# Patient Record
Sex: Female | Born: 1935 | Race: White | Hispanic: No | Smoking: Former smoker
Health system: Southern US, Community
[De-identification: ages and names within clinical notes are randomized; demographics above are authoritative.]

## PROBLEM LIST (undated history)

## (undated) DIAGNOSIS — I639 Cerebral infarction, unspecified: Secondary | ICD-10-CM

## (undated) DIAGNOSIS — F4320 Adjustment disorder, unspecified: Secondary | ICD-10-CM

## (undated) DIAGNOSIS — G4700 Insomnia, unspecified: Secondary | ICD-10-CM

## (undated) DIAGNOSIS — N189 Chronic kidney disease, unspecified: Secondary | ICD-10-CM

## (undated) HISTORY — PX: KNEE ARTHROSCOPY: SUR90

## (undated) HISTORY — PX: REPLACEMENT TOTAL KNEE: SUR1224

## (undated) HISTORY — PX: TOTAL SHOULDER REPLACEMENT: SUR1217

## (undated) HISTORY — PX: VAGINAL HYSTERECTOMY: SUR661

## (undated) NOTE — *Deleted (*Deleted)
Physical Medicine and Rehabilitation Consult   Reason for Consult: Stroke with functional deficits  Referring Physician: Dr. Thedore Mins   HPI: Shannon Nixon is a 5 y.o. female with history of prior strokes, HTN, CKD, adjustment disorder who was admitted on 01/07/20 with acute onset of speech difficulty. MRI brain showed acute high left frontal cortical with additional right parietal, right frontal, posterior left temporal lobe infract suggestive for cardioembolic etiology. 2D echo showed EF 60-65% with grade 1 DD and mild LVH. BLE dopplers and carotid dopplers done and loop recorder placed on 10/25 as part of stroke work up. Therapy evaluations completed and CIR recommended due to functional decline.   Daughter moved her back to [place] 7 months ago. Her daughter assisted with bathing and dressing as well as medication administration. Was independent without AD.    Review of Systems  Constitutional: Negative for chills and fever.  HENT: Negative for hearing loss.   Eyes: Negative for blurred vision and double vision.  Respiratory: Negative for cough and shortness of breath.   Cardiovascular: Negative for chest pain and palpitations.  Gastrointestinal: Negative for constipation, heartburn and nausea.  Musculoskeletal: Negative for falls, joint pain and myalgias.  Skin: Negative for rash.  Neurological: Positive for speech change. Negative for dizziness, tingling, tremors and headaches.  Psychiatric/Behavioral: The patient is nervous/anxious.      Past Medical History:  Diagnosis Date  . Adjustment disorder   . CKD (chronic kidney disease)   . Insomnia   . Stroke (cerebrum) [facility])      Past Surgical History:  Procedure Laterality Date  . KNEE ARTHROSCOPY    . LOOP RECORDER INSERTION N/A 01/09/2020   Procedure: LOOP RECORDER INSERTION;  Surgeon: Marinus Maw, MD;  Location: [facility] INVASIVE CV LAB;  Service: Cardiovascular;  Laterality: N/A;  . REPLACEMENT TOTAL KNEE Left   .  REPLACEMENT TOTAL KNEE Left   . TOTAL SHOULDER REPLACEMENT Right   . VAGINAL HYSTERECTOMY      Family History  Problem Relation Age of Onset  . Heart disease Father     Social History: Lives with family. Independent without AD. She  reports that she has quit smoking-3 years ago--she smoked for 40 years.. She has never used smokeless tobacco. She drinks a glass of wine occasionally.  She reports that she does not use drugs.    Allergies: No Known Allergies    Medications Prior to Admission  Medication Sig Dispense Refill  . aspirin 325 MG tablet Take 325 mg by mouth daily.    Marland Kitchen atorvastatin (LIPITOR) 40 MG tablet Take 40 mg by mouth daily.    . citalopram (CELEXA) 10 MG tablet Take 10 mg by mouth daily.    . COLLAGEN PO Take 1 capsule by mouth in the morning and at bedtime.    . gabapentin (NEURONTIN) 100 MG capsule Take 100 mg by mouth at bedtime.    Marland Kitchen losartan (COZAAR) 25 MG tablet Take 25 mg by mouth daily.    . mirtazapine (REMERON) 15 MG tablet Take 15 mg by mouth at bedtime.    . Multiple Vitamins-Minerals (MULTIVITAMIN WITH MINERALS) tablet Take 1 tablet by mouth daily.    Marland Kitchen omeprazole (PRILOSEC) 40 MG capsule Take 40 mg by mouth daily.    . traZODone (DESYREL) 100 MG tablet Take 100 mg by mouth at bedtime.      Home: Home Living Family/patient expects to be discharged to:: Private residence Living Arrangements: Alone Available Help at Discharge: Family,  Available PRN/intermittently Type of Home: House Home Access: Stairs to enter [employer] of Steps: ??? Home Layout: One level Home Equipment:  (unsure) Additional Comments: pt with aphasia and unable to specify  Functional History: Prior Function Level of Independence: Independent Comments: pt reports independent, living alone. Will need to confirm with family due to communication deficits Functional Status:  Mobility: Bed Mobility Overal bed mobility: Needs Assistance Bed Mobility: Sit to Supine,  Supine to Sit Supine to sit: Supervision Sit to supine: Supervision General bed mobility comments: pt able to get self in and out of bed and position self for comfort Transfers Overall transfer level: Needs assistance Equipment used: Rolling walker (2 wheeled) Transfers: Sit to/from Stand Sit to Stand: Min assist Stand pivot transfers: Min assist General transfer comment: steadying assist throughout Ambulation/Gait Ambulation/Gait assistance: Min assist Gait Distance (Feet): 150 Feet Assistive device: Rolling walker (2 wheeled) Gait Pattern/deviations: Trunk flexed, Step-through pattern General Gait Details: vc's for erect posture though pt maintains slight trunk flexion. Needed tactile cues occasionally to help remain in RW. R foot externally rotated Gait velocity: reduced Gait velocity interpretation: <1.8 ft/sec, indicate of risk for recurrent falls    ADL: ADL Overall ADL's : Needs assistance/impaired Eating/Feeding: Set up, Sitting Grooming: Minimal assistance, Standing, Wash/dry hands, Oral care Grooming Details (indicate cue type and reason): for balance; cues to perform all tasks of handwashing task, cues to locate oral care items on sink (?visuoperceptual deficit)  Upper Body Bathing: Minimal assistance, Sitting Lower Body Bathing: Moderate assistance, Sit to/from stand Upper Body Dressing : Minimal assistance, Sitting Lower Body Dressing: Moderate assistance, Sit to/from stand Toilet Transfer: Minimal assistance, Ambulation, Regular Toilet Toileting- Clothing Manipulation and Hygiene: Sit to/from stand, Sitting/lateral lean, Moderate assistance Toileting - Clothing Manipulation Details (indicate cue type and reason): assist for thoroughness during pericare; cues to pull up mesh underwear prior to exiting bathroom as pt initially attempting to walk out prior to doing so; steadying assist throughout while in standing  Functional mobility during ADLs: Minimal assistance   Cognition: Cognition Overall Cognitive Status: Difficult to assess Arousal/Alertness: Awake/alert Orientation Level: Oriented to person, Oriented to place, Disoriented to situation, Disoriented to time Awareness: Appears intact (difficult to assess d/t aphasia; non-verbal communication ) Behaviors: Verbal agitation, Perseveration, Confabulation Comments: Pt able to indicate call button when asked how to call nurse/help Cognition Arousal/Alertness: Awake/alert Behavior During Therapy: Impulsive Overall Cognitive Status: Difficult to assess General Comments: pt following simple commands today, occasionally commands needed repeating. Followed directional cues in hallway Difficult to assess due to: Impaired communication   Blood pressure (!) 152/84, pulse 80, temperature 98.5 F (36.9 C), temperature source Oral, resp. rate 18, height 5\' 6"  (1.676 m), weight 74.6 kg, SpO2 98 %. Physical Exam Vitals and nursing note reviewed.  Constitutional:      Appearance: Normal appearance.     Comments: Frustrated by expressive deficits.   Neurological:     Mental Status: She is alert and oriented to person, place, and time.     Comments: She was able to state her name. Her  verbal output limited to inaccurate Y/N answers but does not appropriately. She was able to follow simple one step commands. LUE with limitations due to prior surgery. Mild RUE weakness noted.      Results for orders placed or performed during the hospital encounter of 01/07/20 (from the past 24 hour(s))  CBC with Differential/Platelet     Status: Abnormal   Collection Time: 01/10/20  5:02 AM  Result Value  Ref Range   WBC 7.0 4.0 - 10.5 K/uL   RBC 3.60 (L) 3.87 - 5.11 MIL/uL   Hemoglobin 11.3 (L) 12.0 - 15.0 g/dL   HCT 16.1 (L) 36 - 46 %   MCV 94.7 80.0 - 100.0 fL   MCH 31.4 26.0 - 34.0 pg   MCHC 33.1 30.0 - 36.0 g/dL   RDW 09.6 04.5 - 40.9 %   Platelets 187 150 - 400 K/uL   nRBC 0.0 0.0 - 0.2 %   Neutrophils Relative  % 55 %   Neutro Abs 3.9 1.7 - 7.7 K/uL   Lymphocytes Relative 27 %   Lymphs Abs 1.9 0.7 - 4.0 K/uL   Monocytes Relative 15 %   Monocytes Absolute 1.1 (H) 0.1 - 1.0 K/uL   Eosinophils Relative 2 %   Eosinophils Absolute 0.2 0.0 - 0.5 K/uL   Basophils Relative 0 %   Basophils Absolute 0.0 0.0 - 0.1 K/uL   Immature Granulocytes 1 %   Abs Immature Granulocytes 0.06 0.00 - 0.07 K/uL  Comprehensive metabolic panel     Status: Abnormal   Collection Time: 01/10/20  5:02 AM  Result Value Ref Range   Sodium 137 135 - 145 mmol/L   Potassium 3.9 3.5 - 5.1 mmol/L   Chloride 105 98 - 111 mmol/L   CO2 21 (L) 22 - 32 mmol/L   Glucose, Bld 92 70 - 99 mg/dL   BUN 17 8 - 23 mg/dL   Creatinine, Ser 8.11 (H) 0.44 - 1.00 mg/dL   Calcium 9.1 8.9 - 91.4 mg/dL   Total Protein 6.4 (L) 6.5 - 8.1 g/dL   Albumin 3.7 3.5 - 5.0 g/dL   AST 27 15 - 41 U/L   ALT 14 0 - 44 U/L   Alkaline Phosphatase 76 38 - 126 U/L   Total Bilirubin 1.2 0.3 - 1.2 mg/dL   GFR, Estimated 43 (L) >60 mL/min   Anion gap 11 5 - 15  Brain natriuretic peptide     Status: None   Collection Time: 01/10/20  5:02 AM  Result Value Ref Range   B Natriuretic Peptide 66.6 0.0 - 100.0 pg/mL  Magnesium     Status: None   Collection Time: 01/10/20  5:02 AM  Result Value Ref Range   Magnesium 1.7 1.7 - 2.4 mg/dL   EP PPM/ICD IMPLANT  Result Date: 01/09/2020 CONCLUSIONS:  1. Successful implantation of a [employer] Reveal LINQ implantable loop recorder for cryptogenic stroke  2. No early apparent complications. [name], MD 01/09/2020 2:35 PM   ECHOCARDIOGRAM COMPLETE  Result Date: 01/08/2020    ECHOCARDIOGRAM REPORT   Patient Name:   LONISHA BOBBY Date of Exam: 01/08/2020 Medical Rec #:  [id number]         Height:       65.0 in Accession #:    [id number]        Weight:       164.4 lb Date of Birth:  1933/09/11         BSA:          1.820 m Patient Age:    86 years          BP:           121/74 mmHg Patient Gender: F                  HR:           86 bpm. Exam Location:  Inpatient Procedure: 2D Echo,  Cardiac Doppler and Color Doppler Indications:    CVA  History:        Patient has no prior history of Echocardiogram examinations.                 Stroke, Signs/Symptoms:Altered Mental Status; Risk                 Factors:Hypertension and Dyslipidemia.  Sonographer:    Lavenia Atlas Referring Phys: [id number] Cecille Po MELVIN IMPRESSIONS  1. Left ventricular ejection fraction, by estimation, is 60 to 65%. The left ventricle has normal function. The left ventricle has no regional wall motion abnormalities. There is mild left ventricular hypertrophy. Left ventricular diastolic parameters are consistent with Grade I diastolic dysfunction (impaired relaxation).  2. Right ventricular systolic function is normal. The right ventricular size is normal. There is normal pulmonary artery systolic pressure. The estimated right ventricular systolic pressure is 34.8 mmHg.  3. The mitral valve is grossly normal. Mild mitral valve regurgitation. No evidence of mitral stenosis.  4. The aortic valve is tricuspid. There is mild calcification of the aortic valve. There is mild thickening of the aortic valve. Aortic valve regurgitation is not visualized. Mild aortic valve sclerosis is present, with no evidence of aortic valve stenosis.  5. The inferior vena cava is normal in size with greater than 50% respiratory variability, suggesting right atrial pressure of 3 mmHg. FINDINGS  Left Ventricle: Left ventricular ejection fraction, by estimation, is 60 to 65%. The left ventricle has normal function. The left ventricle has no regional wall motion abnormalities. The left ventricular internal cavity size was normal in size. There is  mild left ventricular hypertrophy. Left ventricular diastolic parameters are consistent with Grade I diastolic dysfunction (impaired relaxation). Normal left ventricular filling pressure. Right Ventricle: The right ventricular size is  normal. No increase in right ventricular wall thickness. Right ventricular systolic function is normal. There is normal pulmonary artery systolic pressure. The tricuspid regurgitant velocity is 2.82 m/s, and  with an assumed right atrial pressure of 3 mmHg, the estimated right ventricular systolic pressure is 34.8 mmHg. Left Atrium: Left atrial size was normal in size. Right Atrium: Right atrial size was normal in size. Pericardium: Trivial pericardial effusion is present. Mitral Valve: The mitral valve is grossly normal. There is mild calcification of the mitral valve leaflet(s). Mild mitral valve regurgitation. No evidence of mitral valve stenosis. Tricuspid Valve: The tricuspid valve is grossly normal. Tricuspid valve regurgitation is trivial. No evidence of tricuspid stenosis. Aortic Valve: The aortic valve is tricuspid. There is mild calcification of the aortic valve. There is mild thickening of the aortic valve. Aortic valve regurgitation is not visualized. Mild aortic valve sclerosis is present, with no evidence of aortic valve stenosis. Pulmonic Valve: The pulmonic valve was grossly normal. Pulmonic valve regurgitation is not visualized. No evidence of pulmonic stenosis. Aorta: The aortic root is normal in size and structure. Venous: The inferior vena cava is normal in size with greater than 50% respiratory variability, suggesting right atrial pressure of 3 mmHg. IAS/Shunts: The atrial septum is grossly normal.  LEFT VENTRICLE PLAX 2D LVIDd:         3.60 cm  Diastology LVIDs:         2.60 cm  LV e' medial:    6.20 cm/s LV PW:         1.30 cm  LV E/e' medial:  8.7 LV IVS:        1.30 cm  LV e' lateral:  6.64 cm/s LVOT diam:     1.90 cm  LV E/e' lateral: 8.1 LV SV:         46 LV SV Index:   25 LVOT Area:     2.84 cm  RIGHT VENTRICLE RV Basal diam:  2.80 cm RV S prime:     10.00 cm/s TAPSE (M-mode): 2.7 cm LEFT ATRIUM             Index       RIGHT ATRIUM           Index LA diam:        3.30 cm 1.81 cm/m  RA  Area:     13.50 cm LA Vol (A2C):   17.9 ml 9.84 ml/m  RA Volume:   34.60 ml  19.01 ml/m LA Vol (A4C):   26.8 ml 14.73 ml/m LA Biplane Vol: 22.5 ml 12.36 ml/m  AORTIC VALVE LVOT Vmax:   79.00 cm/s LVOT Vmean:  52.300 cm/s LVOT VTI:    0.163 m  AORTA Ao Root diam: 2.90 cm MITRAL VALVE               TRICUSPID VALVE MV Area (PHT): 3.99 cm    TR Peak grad:   31.8 mmHg MV Decel Time: 190 msec    TR Vmax:        282.00 cm/s MV E velocity: 54.00 cm/s MV A velocity: 79.30 cm/s  SHUNTS MV E/A ratio:  0.68        Systemic VTI:  0.16 m                            Systemic Diam: 1.90 cm Lennie Odor MD Electronically signed by Lennie Odor MD Signature Date/Time: 01/08/2020/3:34:36 PM    Final    ECHO TEE  Result Date: 01/09/2020    TRANSESOPHOGEAL ECHO REPORT   Patient Name:   JYSSICA RIEF Date of Exam: 01/09/2020 Medical Rec #:  [id number]         Height:       65.0 in Accession #:    [id number]        Weight:       164.4 lb Date of Birth:  12/25/1933         BSA:          1.820 m Patient Age:    86 years          BP:           160/80 mmHg Patient Gender: F                 HR:           90 bpm. Exam Location:  Inpatient Procedure: Transesophageal Echo Indications:    Stroke  History:        Patient has prior history of Echocardiogram examinations.  Sonographer:    Thurman Coyer RDCS (AE) Referring Phys: [id number] DAVID L RINEHULS PROCEDURE: After discussion of the risks and benefits of a TEE, an informed consent was obtained. The transesophogeal probe was passed without difficulty through the esophogus of the patient. Local oropharyngeal anesthetic was provided with Benzocaine spray. Sedation performed by different physician. Image quality was excellent. The patient's vital signs; including heart rate, blood pressure, and oxygen saturation; remained stable throughout the procedure. The patient developed no complications during  the procedure. IMPRESSIONS  1. No SOE patient will proceed with ILR implant with Dr  Ladona Ridgel.  2.  Septal hypokinesis . Left ventricular ejection fraction, by estimation, is 50 to 55%. The left ventricle has low normal function. The left ventricle demonstrates regional wall motion abnormalities (see scoring diagram/findings for description). There  is mild asymmetric left ventricular hypertrophy of the basal and septal segments.  3. Right ventricular systolic function is normal. The right ventricular size is normal.  4. No left atrial/left atrial appendage thrombus was detected.  5. The mitral valve is normal in structure. Mild mitral valve regurgitation. No evidence of mitral stenosis.  6. The aortic valve is normal in structure. Aortic valve regurgitation is not visualized. No aortic stenosis is present.  7. The inferior vena cava is normal in size with greater than 50% respiratory variability, suggesting right atrial pressure of 3 mmHg.  8. Agitated saline contrast bubble study was negative, with no evidence of any interatrial shunt. Conclusion(s)/Recommendation(s): Normal biventricular function without evidence of hemodynamically significant valvular heart disease. FINDINGS  Left Ventricle: Septal hypokinesis. Left ventricular ejection fraction, by estimation, is 50 to 55%. The left ventricle has low normal function. The left ventricle demonstrates regional wall motion abnormalities. The left ventricular internal cavity size was normal in size. There is mild asymmetric left ventricular hypertrophy of the basal and septal segments. Right Ventricle: The right ventricular size is normal. No increase in right ventricular wall thickness. Right ventricular systolic function is normal. Left Atrium: Left atrial size was normal in size. No left atrial/left atrial appendage thrombus was detected. Right Atrium: Right atrial size was normal in size. Pericardium: There is no evidence of pericardial effusion. Mitral Valve: The mitral valve is normal in structure. Mild mitral valve regurgitation. No evidence of  mitral valve stenosis. Tricuspid Valve: The tricuspid valve is normal in structure. Tricuspid valve regurgitation is mild . No evidence of tricuspid stenosis. Aortic Valve: The aortic valve is normal in structure. Aortic valve regurgitation is not visualized. No aortic stenosis is present. Pulmonic Valve: The pulmonic valve was normal in structure. Pulmonic valve regurgitation is trivial. No evidence of pulmonic stenosis. Aorta: The aortic root is normal in size and structure. Venous: The inferior vena cava is normal in size with greater than 50% respiratory variability, suggesting right atrial pressure of 3 mmHg. IAS/Shunts: No atrial level shunt detected by color flow Doppler. Agitated saline contrast was given intravenously to evaluate for intracardiac shunting. Agitated saline contrast bubble study was negative, with no evidence of any interatrial shunt. Additional Comments: No SOE patient will proceed with ILR implant with Dr Ladona Ridgel. Charlton Haws MD Electronically signed by Charlton Haws MD Signature Date/Time: 01/09/2020/2:01:59 PM    Final    VAS US CAROTID (at [facility] and [facility] only)  Result Date: 01/09/2020 Carotid Arterial Duplex Study Indications:       CVA and Speech disturbance. Risk Factors:      Hypertension, hyperlipidemia. Other Factors:     History of prior strokes, most recently 2020 at [facility]. Comparison Study:  No prior. Performing Technologist: Marilynne Halsted RDMS, RVT  Examination Guidelines: A complete evaluation includes B-mode imaging, spectral Doppler, color Doppler, and power Doppler as needed of all accessible portions of each vessel. Bilateral testing is considered an integral part of a complete examination. Limited examinations for reoccurring indications may be performed as noted.  Right Carotid Findings: +----------+--------+--------+--------+------------------+------------------+           PSV cm/sEDV cm/sStenosisPlaque DescriptionComments            +----------+--------+--------+--------+------------------+------------------+ CCA Prox  63      18  intimal thickening +----------+--------+--------+--------+------------------+------------------+ CCA Distal80      21                                intimal thickening +----------+--------+--------+--------+------------------+------------------+ ICA Prox  58      19      1-39%   calcific                             +----------+--------+--------+--------+------------------+------------------+ ICA Distal81      27                                                   +----------+--------+--------+--------+------------------+------------------+ ECA       83      13                                                   +----------+--------+--------+--------+------------------+------------------+ +----------+--------+-------+----------------+-------------------+           PSV cm/sEDV cmsDescribe        Arm Pressure (mmHG) +----------+--------+-------+----------------+-------------------+ [id number]             Multiphasic, WNL                    +----------+--------+-------+----------------+-------------------+ +---------+--------+--+--------+--+---------+ VertebralPSV cm/s55EDV cm/s16Antegrade +---------+--------+--+--------+--+---------+  Left Carotid Findings: +----------+--------+--------+--------+------------------+------------------+           PSV cm/sEDV cm/sStenosisPlaque DescriptionComments           +----------+--------+--------+--------+------------------+------------------+ CCA Prox  158     22                                                   +----------+--------+--------+--------+------------------+------------------+ CCA Distal69      22                                intimal thickening +----------+--------+--------+--------+------------------+------------------+ ICA Prox  172     43      40-59%  calcific                              +----------+--------+--------+--------+------------------+------------------+ ICA Mid   98      30                                                   +----------+--------+--------+--------+------------------+------------------+ ICA Distal91      30                                                   +----------+--------+--------+--------+------------------+------------------+ ECA       73                                                           +----------+--------+--------+--------+------------------+------------------+ +----------+--------+--------+----------------+-------------------+  PSV cm/sEDV cm/sDescribe        Arm Pressure (mmHG) +----------+--------+--------+----------------+-------------------+ Subclavian89              Multiphasic, WNL                    +----------+--------+--------+----------------+-------------------+ +---------+--------+--+--------+--+---------+ VertebralPSV cm/s40EDV cm/s13Antegrade +---------+--------+--+--------+--+---------+   Summary: Right Carotid: Velocities in the right ICA are consistent with a 1-39% stenosis. Left Carotid: Velocities in the left ICA are consistent with a 40-59% stenosis. Vertebrals: Bilateral vertebral arteries demonstrate antegrade flow. *See table(s) above for measurements and observations.  Electronically signed by Delia Heady MD on 01/09/2020 at 12:56:11 PM.    Final    VAS [place] LOWER EXTREMITY VENOUS (DVT)  Result Date: 01/09/2020  Lower Venous DVTStudy Indications: Embolic strokes.  Comparison Study: No prior. Performing Technologist: Marilynne Halsted RDMS, RVT  Examination Guidelines: A complete evaluation includes B-mode imaging, spectral Doppler, color Doppler, and power Doppler as needed of all accessible portions of each vessel. Bilateral testing is considered an integral part of a complete examination. Limited examinations for reoccurring indications may be performed  as noted. The reflux portion of the exam is performed with the patient in reverse Trendelenburg.  +---------+---------------+---------+-----------+----------+--------------+ RIGHT    CompressibilityPhasicitySpontaneityPropertiesThrombus Aging +---------+---------------+---------+-----------+----------+--------------+ CFV      Full           Yes      Yes                                 +---------+---------------+---------+-----------+----------+--------------+ SFJ      Full                                                        +---------+---------------+---------+-----------+----------+--------------+ FV Prox  Full                                                        +---------+---------------+---------+-----------+----------+--------------+ FV Mid   Full                                                        +---------+---------------+---------+-----------+----------+--------------+ FV DistalFull                                                        +---------+---------------+---------+-----------+----------+--------------+ PFV      Full                                                        +---------+---------------+---------+-----------+----------+--------------+ POP      Full           Yes  Yes                                 +---------+---------------+---------+-----------+----------+--------------+ PTV      Full                                                        +---------+---------------+---------+-----------+----------+--------------+ PERO     Full                                                        +---------+---------------+---------+-----------+----------+--------------+   +---------+---------------+---------+-----------+----------+--------------+ LEFT     CompressibilityPhasicitySpontaneityPropertiesThrombus Aging +---------+---------------+---------+-----------+----------+--------------+ CFV      Full            Yes      Yes                                 +---------+---------------+---------+-----------+----------+--------------+ SFJ      Full                                                        +---------+---------------+---------+-----------+----------+--------------+ FV Prox  Full                                                        +---------+---------------+---------+-----------+----------+--------------+ FV Mid   Full                                                        +---------+---------------+---------+-----------+----------+--------------+ FV DistalFull                                                        +---------+---------------+---------+-----------+----------+--------------+ PFV      Full                                                        +---------+---------------+---------+-----------+----------+--------------+ POP      Full           Yes      Yes                                 +---------+---------------+---------+-----------+----------+--------------+ PTV      Full                                                        +---------+---------------+---------+-----------+----------+--------------+  PERO     Full                                                        +---------+---------------+---------+-----------+----------+--------------+     Summary: BILATERAL: - No evidence of deep vein thrombosis seen in the lower extremities, bilaterally. -No evidence of popliteal cyst, bilaterally.   *See table(s) above for measurements and observations. Electronically signed by Fabienne Bruns MD on 01/09/2020 at 6:22:26 PM.    Final     ***  Jacquelynn Cree, PA-C 01/10/2020

---

## 2018-02-06 ENCOUNTER — Emergency Department (HOSPITAL_BASED_OUTPATIENT_CLINIC_OR_DEPARTMENT_OTHER): Payer: Medicare HMO

## 2018-02-06 ENCOUNTER — Other Ambulatory Visit: Payer: Self-pay

## 2018-02-06 ENCOUNTER — Encounter (HOSPITAL_BASED_OUTPATIENT_CLINIC_OR_DEPARTMENT_OTHER): Payer: Self-pay | Admitting: Emergency Medicine

## 2018-02-06 ENCOUNTER — Emergency Department (HOSPITAL_BASED_OUTPATIENT_CLINIC_OR_DEPARTMENT_OTHER)
Admission: EM | Admit: 2018-02-06 | Discharge: 2018-02-06 | Disposition: A | Discharge status: Home / Self Care | Payer: Medicare HMO | Attending: Emergency Medicine | Admitting: Emergency Medicine

## 2018-02-06 DIAGNOSIS — S22080A Wedge compression fracture of T11-T12 vertebra, initial encounter for closed fracture: Secondary | ICD-10-CM | POA: Insufficient documentation

## 2018-02-06 DIAGNOSIS — Z87891 Personal history of nicotine dependence: Secondary | ICD-10-CM | POA: Diagnosis not present

## 2018-02-06 DIAGNOSIS — W07XXXA Fall from chair, initial encounter: Secondary | ICD-10-CM | POA: Insufficient documentation

## 2018-02-06 DIAGNOSIS — M5489 Other dorsalgia: Secondary | ICD-10-CM | POA: Diagnosis present

## 2018-02-06 DIAGNOSIS — Y999 Unspecified external cause status: Secondary | ICD-10-CM | POA: Insufficient documentation

## 2018-02-06 DIAGNOSIS — Y9389 Activity, other specified: Secondary | ICD-10-CM | POA: Insufficient documentation

## 2018-02-06 DIAGNOSIS — Y929 Unspecified place or not applicable: Secondary | ICD-10-CM | POA: Diagnosis not present

## 2018-02-06 IMAGING — CR DG LUMBAR SPINE COMPLETE 4+V
5 series · 5 of 5 positions shown · non-contrast
Comparison: None.

CLINICAL DATA: Acute low back pain following fall yesterday.
Initial encounter.

EXAM:
LUMBAR SPINE - COMPLETE 4+ VIEW

[t l-spine a.p.]
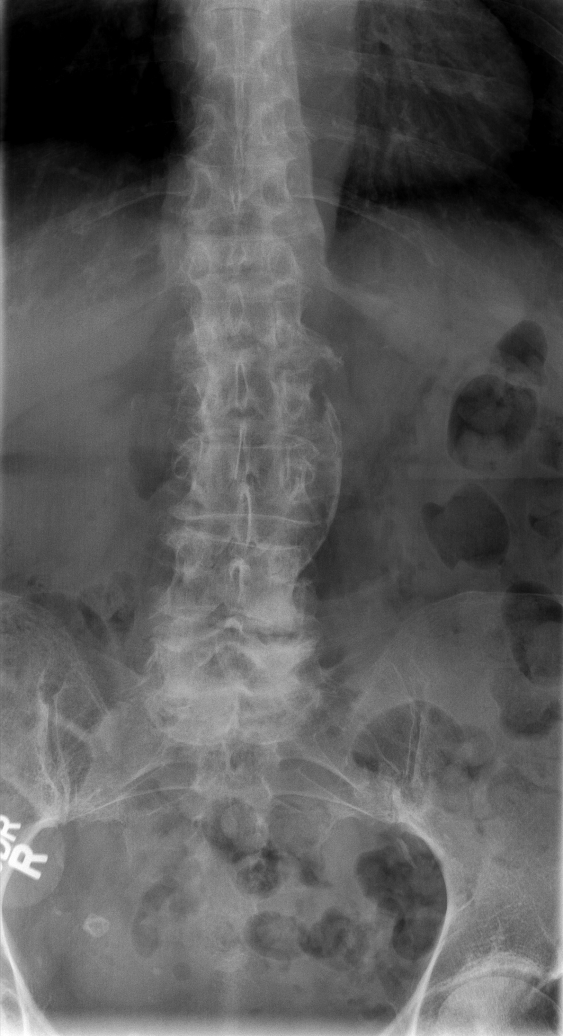

[t l-spine oblique exposure (1 of 2)]
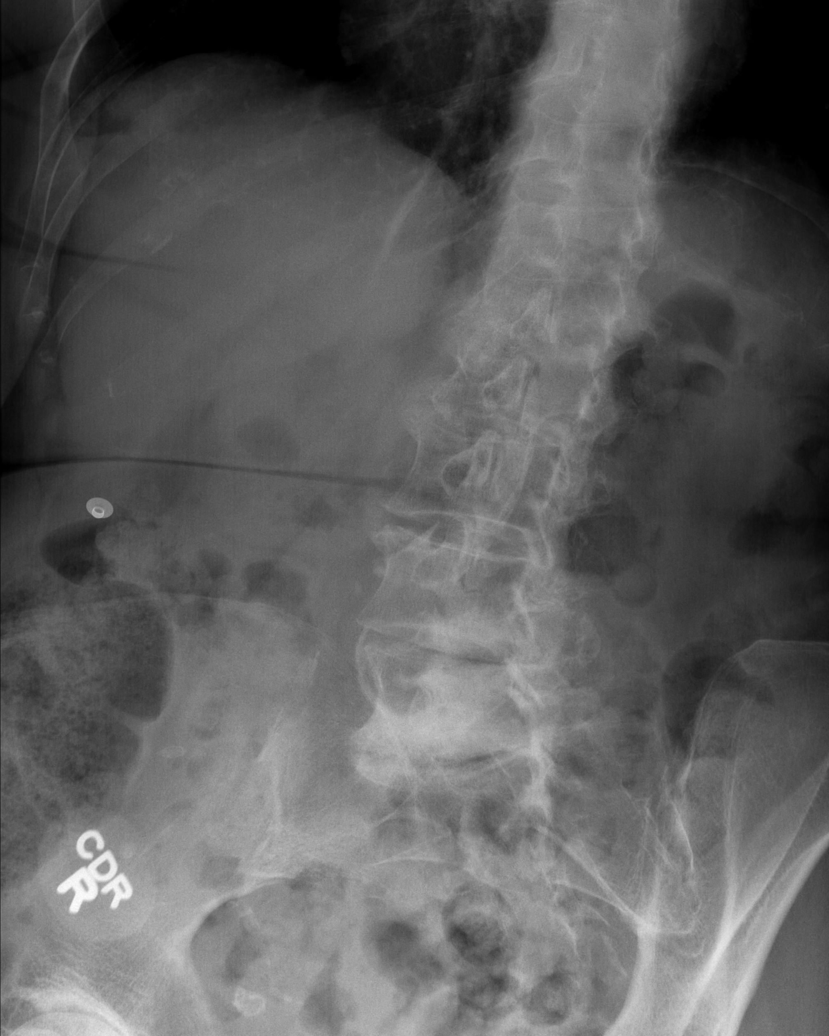

[t l-spine oblique exposure (2 of 2)]
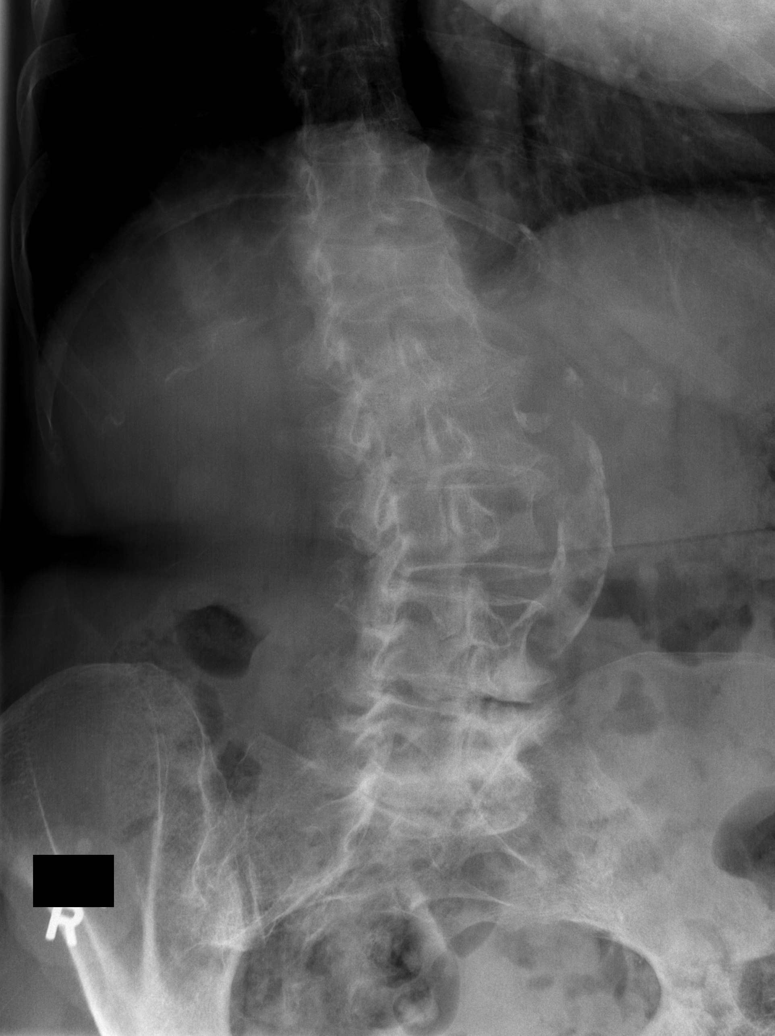

[t l-spine lat]
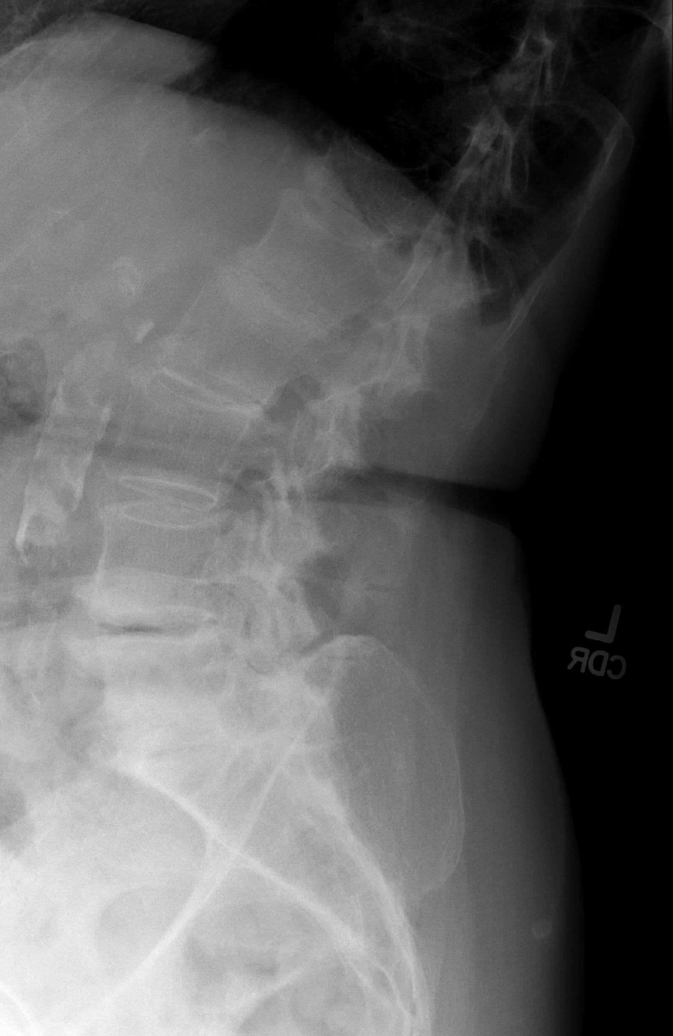

[t l-spine l5-s1 spot]
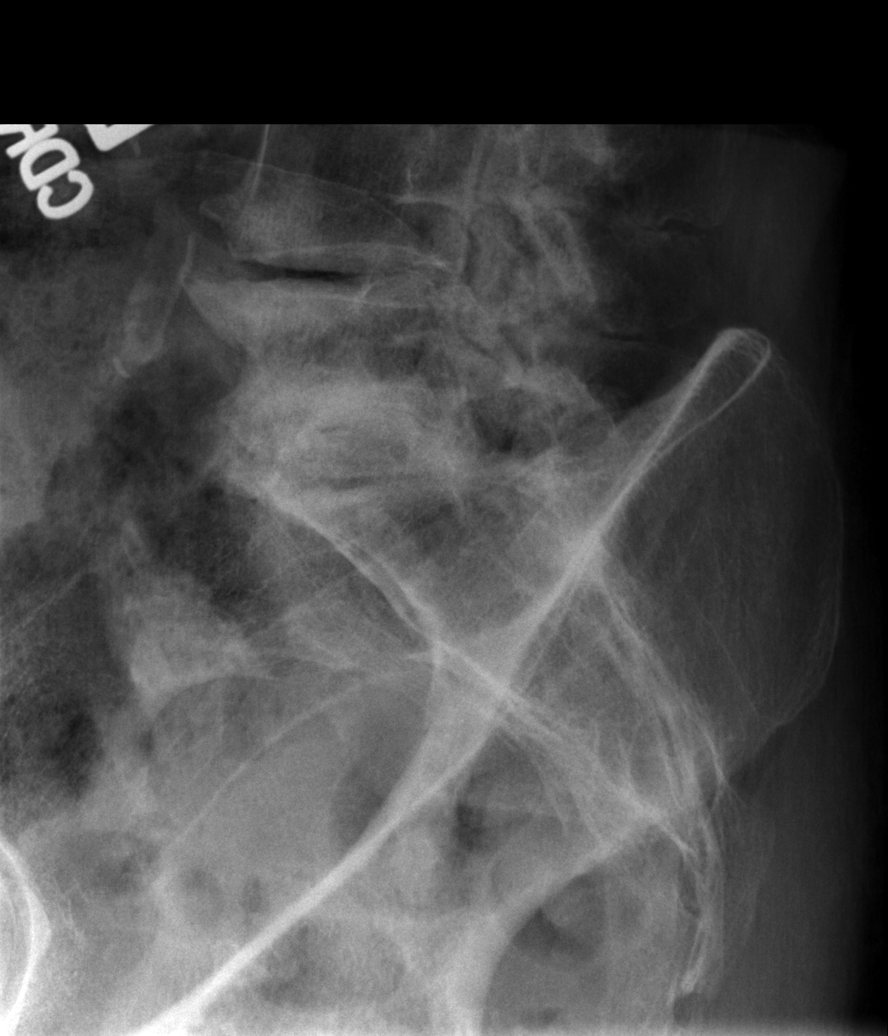

[5 of 5 positions shown; findings below may reference images not displayed]

FINDINGS: Mild apex LEFT lumbar scoliosis noted.

50% T12 compression fracture is age indeterminate.

No other fracture or subluxation identified.

Moderate to severe multilevel degenerative disc disease, spondylosis
and facet arthropathy noted, greatest from L4-S1.

No focal bony lesions or spondylolysis noted.

Aortic atherosclerotic calcifications noted.
IMPRESSION: 1. Age indeterminate 50% T12 compression fracture. Correlate with
pain.
2. Moderate to severe multilevel degenerative changes within the
lumbar spine.

## 2018-02-06 MED ORDER — CYCLOBENZAPRINE HCL 5 MG PO TABS: 5.0000 mg | ORAL_TABLET | Freq: Three times a day (TID) | ORAL | 0 refills | Status: DC | PRN

## 2018-02-06 MED ORDER — CYCLOBENZAPRINE HCL 5 MG PO TABS
5.0000 mg | ORAL_TABLET | Freq: Once | ORAL | Status: AC
  Administered 2018-02-06: 5 mg via ORAL
  Filled 2018-02-06: qty 1

## 2018-02-06 MED ORDER — TRAMADOL HCL 50 MG PO TABS: 50.0000 mg | ORAL_TABLET | Freq: Four times a day (QID) | ORAL | 0 refills | Status: DC | PRN

## 2018-02-06 MED ORDER — IBUPROFEN 400 MG PO TABS
600.0000 mg | ORAL_TABLET | Freq: Once | ORAL | Status: AC
  Administered 2018-02-06: 600 mg via ORAL
  Filled 2018-02-06: qty 1

## 2018-02-06 NOTE — ED Notes (Signed)
Pt on auto VS  

## 2018-02-06 NOTE — Discharge Instructions (Signed)
Take motrin 600 mg every 6 hrs for pain.   Take flexeril as needed for muscle spasms.   Take tramadol only if you have severe pain. Don't drive with it   Consider starting physical therapy with your doctor.   Follow up with neurosurgeon if you have persistent pain in a week   Return to ER if you have worse back pain, trouble walking, numbness, weakness

## 2018-02-06 NOTE — ED Triage Notes (Addendum)
Pt tripped and fell yesterday while chasing a bug. Pt denies LOC. C/o thoracic back pain.

## 2018-02-06 NOTE — ED Provider Notes (Signed)
[facility] EMERGENCY DEPARTMENT Provider Note   CSN: [id number] Arrival date & time: 02/06/18  1309     History   Chief Complaint Chief Complaint  Patient presents with  . Fall    HPI Shannon Nixon is a 84 y.o. female always healthy here presenting with back pain.  Patient states that she was on top of a chair trying to spray a bug and fell off the chair and hit her lower back.  She did not have any head injury or loss of consciousness and denies any upper back pain.  Denies any extremity trauma.  No meds prior to arrival.  The history is provided by the patient.    History reviewed. No pertinent past medical history.  There are no active problems to display for this patient.   Past Surgical History:  Procedure Laterality Date  . KNEE ARTHROSCOPY       OB History   None      Home Medications    Prior to Admission medications   Not on File    Family History No family history on file.  Social History Social History   Tobacco Use  . Smoking status: Former Games developer  . Smokeless tobacco: Never Used  Substance Use Topics  . Alcohol use: Not Currently  . Drug use: Never     Allergies   Patient has no known allergies.   Review of Systems Review of Systems  Musculoskeletal: Positive for back pain.  All other systems reviewed and are negative.    Physical Exam Updated Vital Signs BP 122/86   Pulse 81   Temp 97.8 F (36.6 C) (Oral)   Resp 18   Ht 5\' 5"  (1.651 m)   Wt 63.5 kg   SpO2 100%   BMI 23.30 kg/m   Physical Exam  Constitutional: She is oriented to person, place, and time. She appears well-developed and well-nourished.  HENT:  Head: Normocephalic and atraumatic.  Mouth/Throat: Oropharynx is clear and moist.  No obvious scalp hematoma   Eyes: Pupils are equal, round, and reactive to light. Conjunctivae and EOM are normal.  Neck: Normal range of motion. Neck supple.  No midline tenderness   Cardiovascular: Normal rate,  regular rhythm and normal heart sounds.  Pulmonary/Chest: Effort normal and breath sounds normal. No stridor. No respiratory distress.  Abdominal: Soft. Bowel sounds are normal. She exhibits no distension. There is no tenderness.  Musculoskeletal:  + mild diffuse mid lumbar tenderness, no obvious deformity or step off. Neg straight leg raise   Neurological: She is alert and oriented to person, place, and time.  No saddle anesthesia. Nl strength and sensation throughout. Nl reflexes bilaterally. Nl gait   Skin: Skin is warm.  Psychiatric: She has a normal mood and affect.  Nursing note and vitals reviewed.    ED Treatments / Results  Labs (all labs ordered are listed, but only abnormal results are displayed) Labs Reviewed - No data to display  EKG None  Radiology Dg Lumbar Spine Complete  Result Date: 02/06/2018 CLINICAL DATA:  Acute low back pain following fall yesterday. Initial encounter. EXAM: LUMBAR SPINE - COMPLETE 4+ VIEW COMPARISON:  None. FINDINGS: Mild apex LEFT lumbar scoliosis noted. 50% T12 compression fracture is age indeterminate. No other fracture or subluxation identified. Moderate to severe multilevel degenerative disc disease, spondylosis and facet arthropathy noted, greatest from L4-S1. No focal bony lesions or spondylolysis noted. Aortic atherosclerotic calcifications noted. IMPRESSION: 1. Age indeterminate 50% T12 compression fracture. Correlate with pain.  2. Moderate to severe multilevel degenerative changes within the lumbar spine. Electronically Signed   By: Harmon PierJeffrey  Hu M.D.   On: 02/06/2018 15:54    Procedures Procedures (including critical care time)  Medications Ordered in ED Medications  ibuprofen (ADVIL,MOTRIN) tablet 600 mg (600 mg Oral Given 02/06/18 1508)  cyclobenzaprine (FLEXERIL) tablet 5 mg (5 mg Oral Given 02/06/18 1508)     Initial Impression / Assessment and Plan / ED Course  I have reviewed the triage vital signs and the nursing  notes.  Pertinent labs & imaging results that were available during my care of the patient were reviewed by me and considered in my medical decision making (see chart for details).    Shannon Nixon is a 84 y.o. female here with fall with back injury. Likely muscle strain vs compression fracture. Neurovascular intact, no need for MRI. Will get xrays and give motrin, flexeril.   4:07 PM Xray showed age indeterminate T12 compression fracture. I think likely from recent fall. Pain controlled with motrin, flexeril. Will give short course of tramadol, refer to spine for follow up. Recommend that she start physical therapy with her doctor.    Final Clinical Impressions(s) / ED Diagnoses   Final diagnoses:  None    ED Discharge Orders    None       Charlynne PanderYao, Woodie Trusty Hsienta, MD 02/06/18 1609

## 2018-03-18 DIAGNOSIS — M1612 Unilateral primary osteoarthritis, left hip: Secondary | ICD-10-CM | POA: Insufficient documentation

## 2018-03-30 DIAGNOSIS — M1611 Unilateral primary osteoarthritis, right hip: Secondary | ICD-10-CM | POA: Diagnosis present

## 2018-04-12 DIAGNOSIS — E7800 Pure hypercholesterolemia, unspecified: Secondary | ICD-10-CM | POA: Diagnosis present

## 2018-04-12 DIAGNOSIS — I10 Essential (primary) hypertension: Secondary | ICD-10-CM | POA: Diagnosis present

## 2018-07-21 DIAGNOSIS — F5102 Adjustment insomnia: Secondary | ICD-10-CM | POA: Insufficient documentation

## 2018-07-21 DIAGNOSIS — F4321 Adjustment disorder with depressed mood: Secondary | ICD-10-CM | POA: Insufficient documentation

## 2018-08-27 DIAGNOSIS — Z8673 Personal history of transient ischemic attack (TIA), and cerebral infarction without residual deficits: Secondary | ICD-10-CM

## 2019-11-24 DIAGNOSIS — N1832 Chronic kidney disease, stage 3b: Secondary | ICD-10-CM | POA: Diagnosis present

## 2020-01-07 ENCOUNTER — Inpatient Hospital Stay (HOSPITAL_COMMUNITY): Payer: Medicare HMO

## 2020-01-07 ENCOUNTER — Encounter (HOSPITAL_COMMUNITY): Payer: Self-pay | Admitting: Internal Medicine

## 2020-01-07 ENCOUNTER — Emergency Department (HOSPITAL_COMMUNITY): Payer: Medicare HMO

## 2020-01-07 ENCOUNTER — Inpatient Hospital Stay (HOSPITAL_COMMUNITY)
Admission: EM | Admit: 2020-01-07 | Discharge: 2020-01-10 | DRG: 042 | Disposition: A | Discharge status: Home Health | Payer: Medicare HMO | Attending: Internal Medicine | Admitting: Internal Medicine

## 2020-01-07 DIAGNOSIS — Z7982 Long term (current) use of aspirin: Secondary | ICD-10-CM

## 2020-01-07 DIAGNOSIS — Z87891 Personal history of nicotine dependence: Secondary | ICD-10-CM | POA: Diagnosis not present

## 2020-01-07 DIAGNOSIS — Z8673 Personal history of transient ischemic attack (TIA), and cerebral infarction without residual deficits: Secondary | ICD-10-CM

## 2020-01-07 DIAGNOSIS — E785 Hyperlipidemia, unspecified: Secondary | ICD-10-CM | POA: Diagnosis present

## 2020-01-07 DIAGNOSIS — R29703 NIHSS score 3: Secondary | ICD-10-CM | POA: Diagnosis not present

## 2020-01-07 DIAGNOSIS — Z79899 Other long term (current) drug therapy: Secondary | ICD-10-CM

## 2020-01-07 DIAGNOSIS — I6340 Cerebral infarction due to embolism of unspecified cerebral artery: Principal | ICD-10-CM | POA: Diagnosis present

## 2020-01-07 DIAGNOSIS — Z20822 Contact with and (suspected) exposure to covid-19: Secondary | ICD-10-CM | POA: Diagnosis not present

## 2020-01-07 DIAGNOSIS — Z8249 Family history of ischemic heart disease and other diseases of the circulatory system: Secondary | ICD-10-CM | POA: Diagnosis not present

## 2020-01-07 DIAGNOSIS — G4700 Insomnia, unspecified: Secondary | ICD-10-CM | POA: Diagnosis present

## 2020-01-07 DIAGNOSIS — I639 Cerebral infarction, unspecified: Secondary | ICD-10-CM | POA: Diagnosis not present

## 2020-01-07 DIAGNOSIS — I6389 Other cerebral infarction: Secondary | ICD-10-CM | POA: Diagnosis not present

## 2020-01-07 DIAGNOSIS — K219 Gastro-esophageal reflux disease without esophagitis: Secondary | ICD-10-CM | POA: Diagnosis present

## 2020-01-07 DIAGNOSIS — I10 Essential (primary) hypertension: Secondary | ICD-10-CM

## 2020-01-07 DIAGNOSIS — F4321 Adjustment disorder with depressed mood: Secondary | ICD-10-CM | POA: Diagnosis present

## 2020-01-07 DIAGNOSIS — I129 Hypertensive chronic kidney disease with stage 1 through stage 4 chronic kidney disease, or unspecified chronic kidney disease: Secondary | ICD-10-CM | POA: Diagnosis present

## 2020-01-07 DIAGNOSIS — N1832 Chronic kidney disease, stage 3b: Secondary | ICD-10-CM | POA: Diagnosis present

## 2020-01-07 DIAGNOSIS — E7800 Pure hypercholesterolemia, unspecified: Secondary | ICD-10-CM | POA: Diagnosis not present

## 2020-01-07 DIAGNOSIS — M1611 Unilateral primary osteoarthritis, right hip: Secondary | ICD-10-CM | POA: Diagnosis present

## 2020-01-07 DIAGNOSIS — R4701 Aphasia: Secondary | ICD-10-CM | POA: Diagnosis not present

## 2020-01-07 DIAGNOSIS — I340 Nonrheumatic mitral (valve) insufficiency: Secondary | ICD-10-CM | POA: Diagnosis not present

## 2020-01-07 HISTORY — DX: Adjustment disorder, unspecified: F43.20

## 2020-01-07 HISTORY — DX: Chronic kidney disease, unspecified: N18.9

## 2020-01-07 HISTORY — DX: Cerebral infarction, unspecified: I63.9

## 2020-01-07 HISTORY — DX: Insomnia, unspecified: G47.00

## 2020-01-07 LAB — COMPREHENSIVE METABOLIC PANEL
ALT: 17 U/L (ref 0–44)
AST: 33 U/L (ref 15–41)
Albumin: 4.3 g/dL (ref 3.5–5.0)
Alkaline Phosphatase: 80 U/L (ref 38–126)
Anion gap: 13 (ref 5–15)
BUN: 24 mg/dL — ABNORMAL HIGH (ref 8–23)
CO2: 21 mmol/L — ABNORMAL LOW (ref 22–32)
Calcium: 9.7 mg/dL (ref 8.9–10.3)
Chloride: 104 mmol/L (ref 98–111)
Creatinine, Ser: 1.44 mg/dL — ABNORMAL HIGH (ref 0.44–1.00)
GFR, Estimated: 36 mL/min — ABNORMAL LOW (ref >60)
Glucose, Bld: 107 mg/dL — ABNORMAL HIGH (ref 70–99)
Potassium: 4.3 mmol/L (ref 3.5–5.1)
Sodium: 138 mmol/L (ref 135–145)
Total Bilirubin: 1.3 mg/dL — ABNORMAL HIGH (ref 0.3–1.2)
Total Protein: 7.1 g/dL (ref 6.5–8.1)

## 2020-01-07 LAB — DIFFERENTIAL
Abs Immature Granulocytes: 0.14 10*3/uL — ABNORMAL HIGH (ref 0.00–0.07)
Basophils Absolute: 0 10*3/uL (ref 0.0–0.1)
Basophils Relative: 0 %
Eosinophils Absolute: 0.2 10*3/uL (ref 0.0–0.5)
Eosinophils Relative: 2 %
Immature Granulocytes: 1 %
Lymphocytes Relative: 22 %
Lymphs Abs: 2.2 10*3/uL (ref 0.7–4.0)
Monocytes Absolute: 1.6 10*3/uL — ABNORMAL HIGH (ref 0.1–1.0)
Monocytes Relative: 15 %
Neutro Abs: 6.2 10*3/uL (ref 1.7–7.7)
Neutrophils Relative %: 60 %

## 2020-01-07 LAB — I-STAT CHEM 8, ED
BUN: 25 mg/dL — ABNORMAL HIGH (ref 8–23)
Calcium, Ion: 1.13 mmol/L — ABNORMAL LOW (ref 1.15–1.40)
Chloride: 106 mmol/L (ref 98–111)
Creatinine, Ser: 1.4 mg/dL — ABNORMAL HIGH (ref 0.44–1.00)
Glucose, Bld: 106 mg/dL — ABNORMAL HIGH (ref 70–99)
HCT: 38 % (ref 36.0–46.0)
Hemoglobin: 12.9 g/dL (ref 12.0–15.0)
Potassium: 4.4 mmol/L (ref 3.5–5.1)
Sodium: 139 mmol/L (ref 135–145)
TCO2: 22 mmol/L (ref 22–32)

## 2020-01-07 LAB — PROTIME-INR
INR: 1.1 (ref 0.8–1.2)
Prothrombin Time: 13.9 seconds (ref 11.4–15.2)

## 2020-01-07 LAB — CBC
HCT: 40.3 % (ref 36.0–46.0)
Hemoglobin: 12.8 g/dL (ref 12.0–15.0)
MCH: 31.7 pg (ref 26.0–34.0)
MCHC: 31.8 g/dL (ref 30.0–36.0)
MCV: 99.8 fL (ref 80.0–100.0)
Platelets: 181 10*3/uL (ref 150–400)
RBC: 4.04 MIL/uL (ref 3.87–5.11)
RDW: 13.7 % (ref 11.5–15.5)
WBC: 10.3 10*3/uL (ref 4.0–10.5)
nRBC: 0 % (ref 0.0–0.2)

## 2020-01-07 LAB — APTT: aPTT: 26 seconds (ref 24–36)

## 2020-01-07 LAB — CBG MONITORING, ED: Glucose-Capillary: 101 mg/dL — ABNORMAL HIGH (ref 70–99)

## 2020-01-07 LAB — RESPIRATORY PANEL BY RT PCR (FLU A&B, COVID)
Influenza A by PCR: NEGATIVE
Influenza B by PCR: NEGATIVE
SARS Coronavirus 2 by RT PCR: NEGATIVE

## 2020-01-07 IMAGING — CT CT HEAD CODE STROKE
4 series · 16 of 47 positions shown, 18 images · non-contrast
Comparison: MRI and CT from a [DATE]

CLINICAL DATA: Code stroke. Neuro deficit, acute stroke suspected.
Slurred speech and dysphagia.

EXAM:
CT HEAD WITHOUT CONTRAST
TECHNIQUE: Contiguous axial images were obtained from the base of the skull
through the vertex without intravenous contrast.

[Series 3: head wo · axial · 0.45mm/px · z∈[-165,-45]mm · 7 of 33 slices shown, 9 images]
[im 5/33  brain]
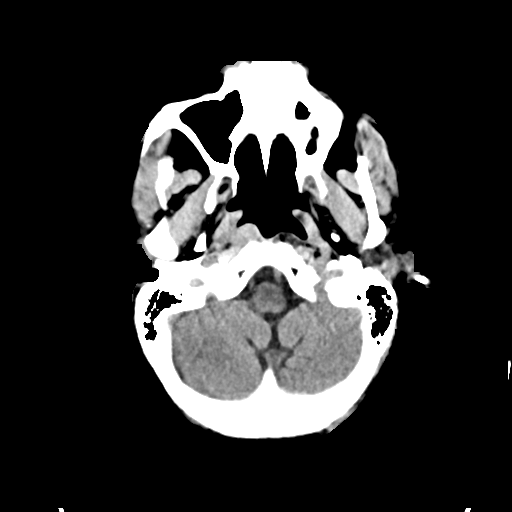
[im 5/33  bone]
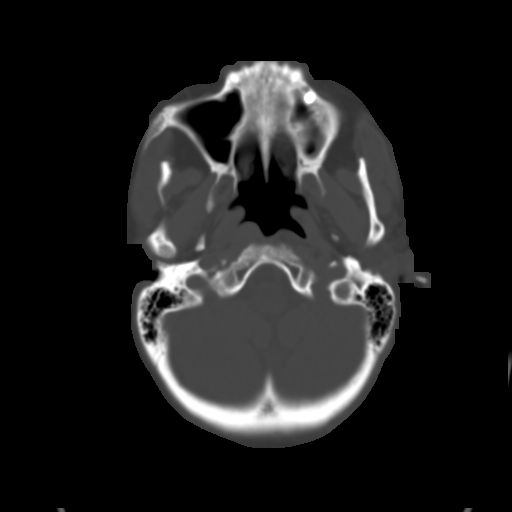
[im 9/33  brain]
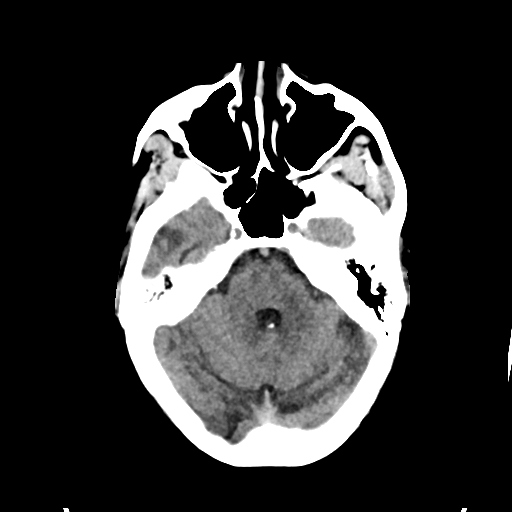
[im 13/33  brain]
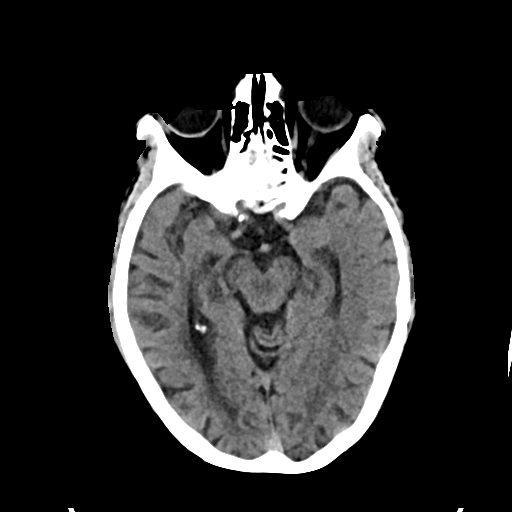
[im 17/33  brain]
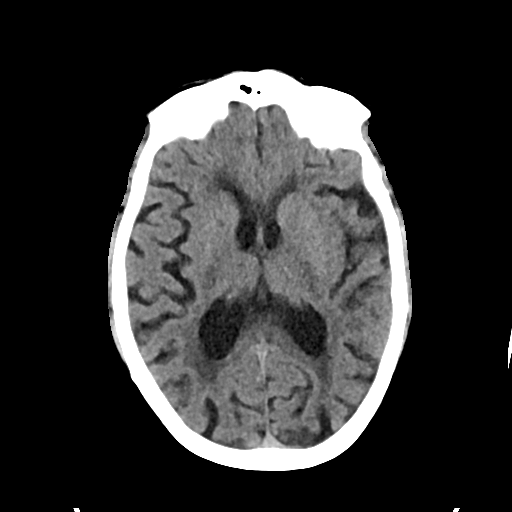
[im 21/33  brain]
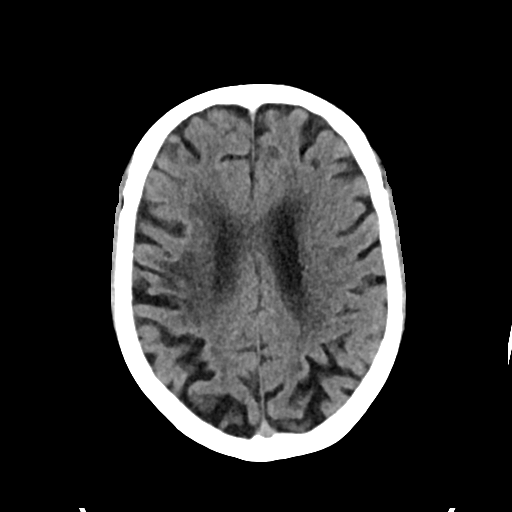
[im 21/33  bone]
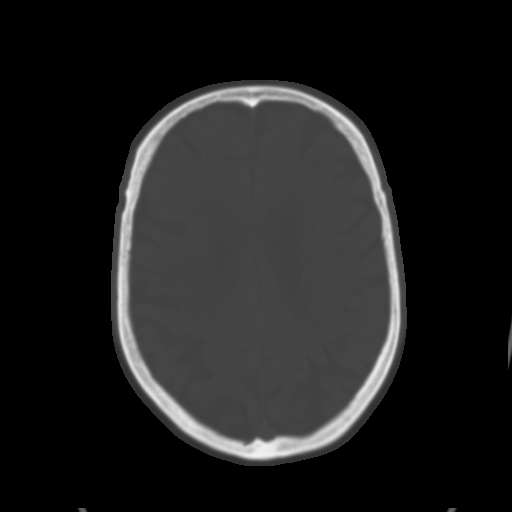
[im 25/33  brain]
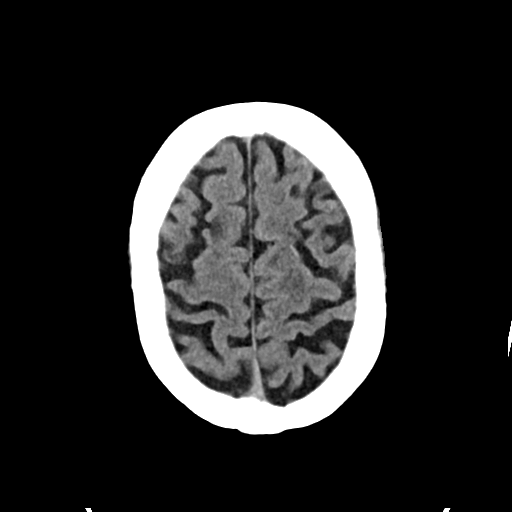
[im 29/33  brain]
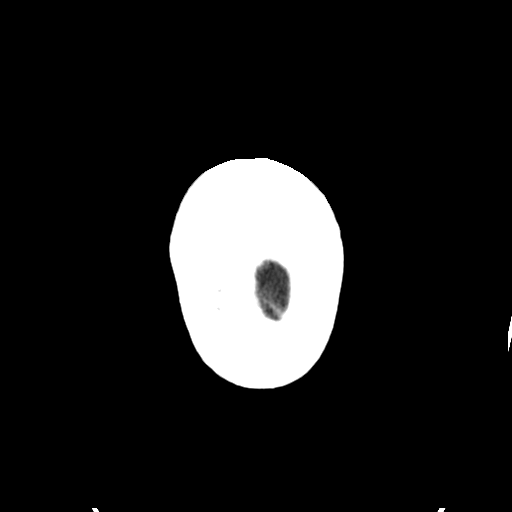

[Series 4: head bone · axial · 0.45mm/px · z∈[-169,-137]mm · 3 of 83 slices shown]
[im 9/83  bone]
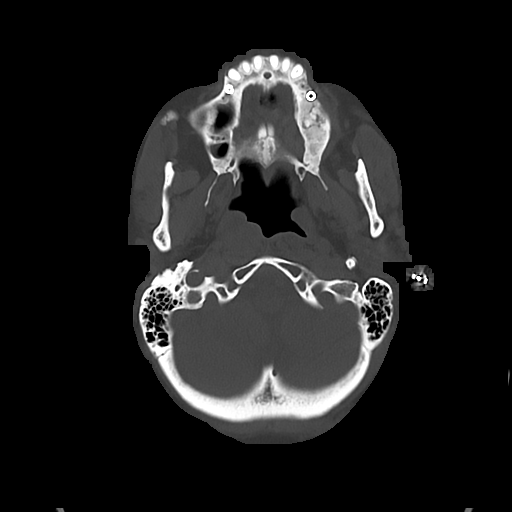
[im 17/83  bone]
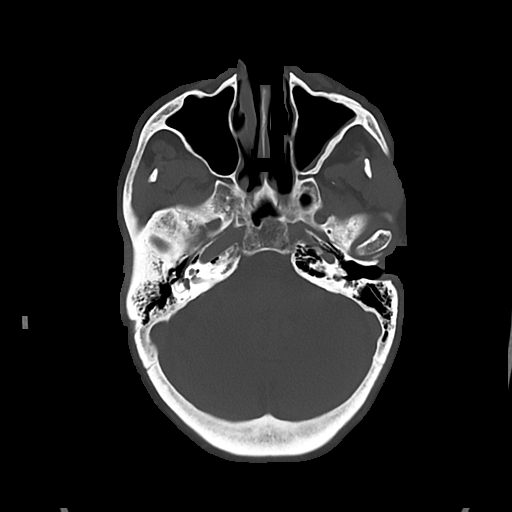
[im 25/83  bone]
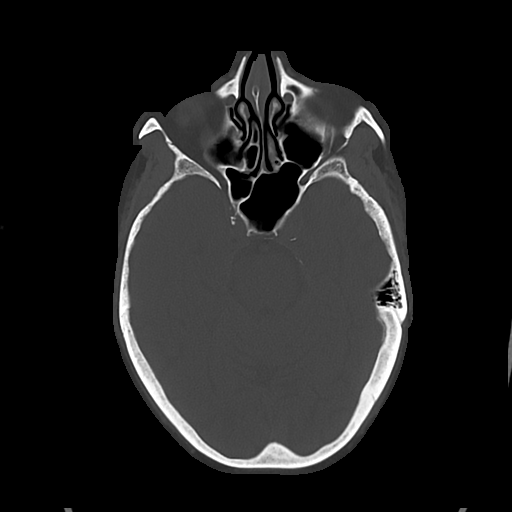

[Series 5: cor soft · coronal · 0.34mm/px · 3 of 72 slices shown]
[im 24/72  brain]
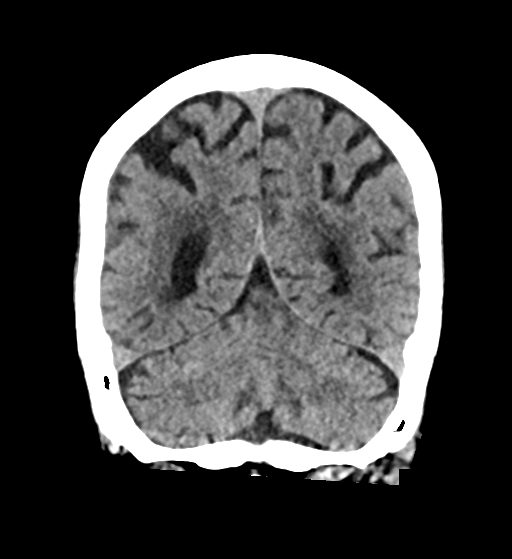
[im 32/72  brain]
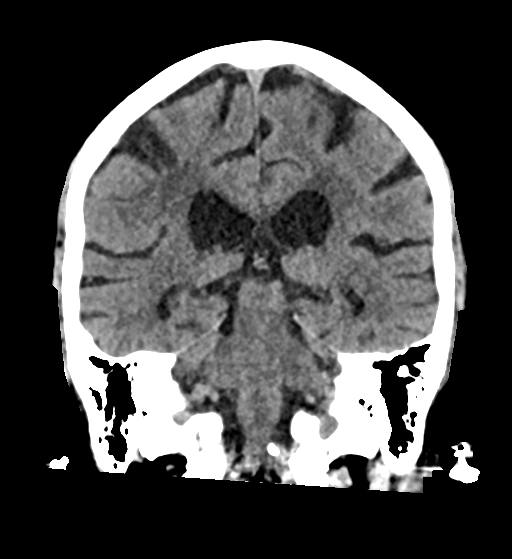
[im 40/72  brain]
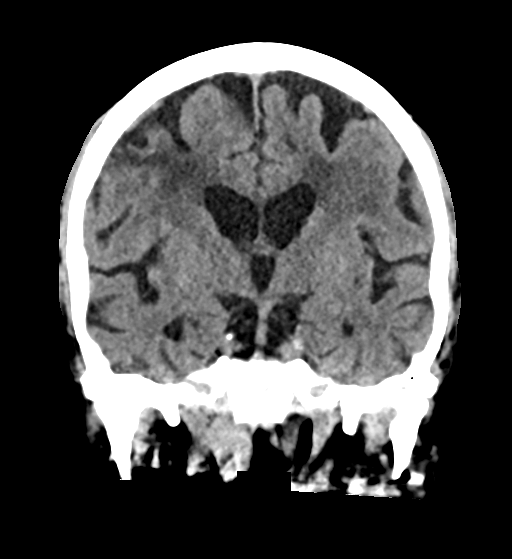

[Series 6: sag soft · sagittal · 0.40mm/px · 3 of 53 slices shown]
[im 18/53  brain]
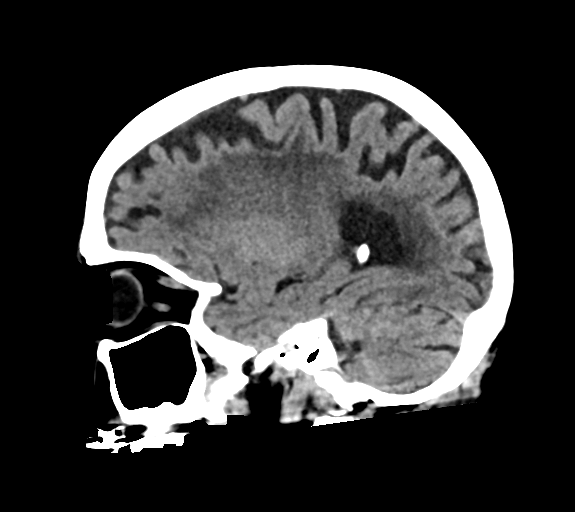
[im 27/53  brain]
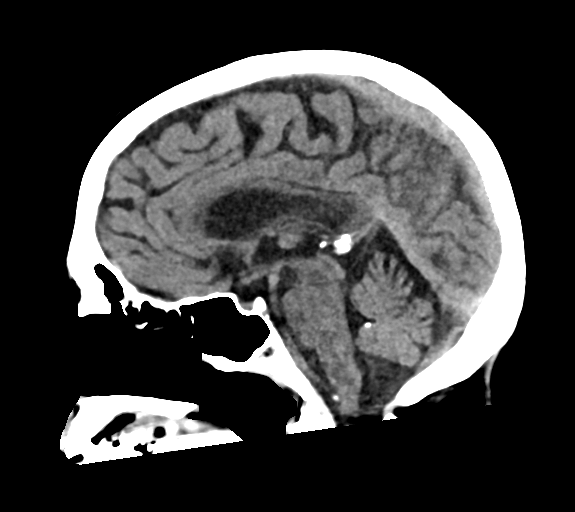
[im 35/53  brain]
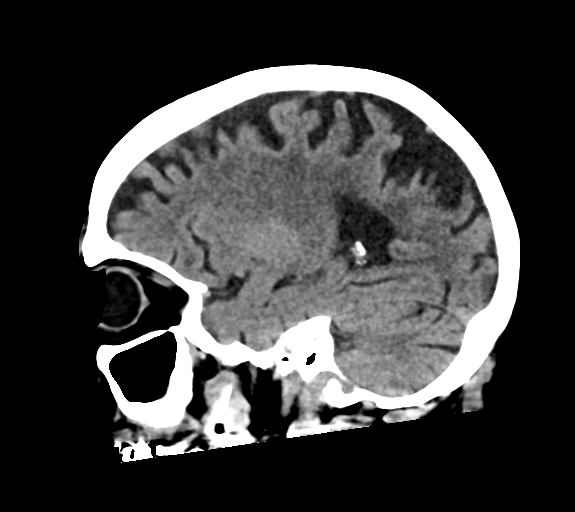

[16 of 47 positions shown; findings below may reference images not displayed]

FINDINGS: Brain: Slight progression of hypoattenuation in the high right
frontoparietal region, in the region of infarcts seen on prior MRI.
There is suggestion of volume loss in this region additional patchy
areas of white matter attenuation are similar to prior and likely
represent the sequela of chronic microvascular ischemic disease or
prior small infarcts. No acute hemorrhage.

Vascular: No hyperdense vessel or unexpected calcification.

Skull: No acute fracture.

Sinuses/Orbits: Opacification a left posterior ethmoid air cell.
Scattered mucosal thickening. Unremarkable orbits.

Other: No mastoid effusions.
IMPRESSION: 1. Slight progression of hypoattenuation in the high right
frontoparietal region. This is favored to represent evolution of
infarcts seen in this region on the prior MRI given suggestion of
associated volume loss; however, superimposed new/acute infarct is
difficult to exclude. An MRI could further evaluate if clinically
indicated.
2. No acute hemorrhage.

Code stroke imaging results were communicated on [DATE] at [DATE] to provider Dr. TIGER via telephone, who verbally acknowledged
these results.

## 2020-01-07 IMAGING — MR MR MRA HEAD W/O CM
1 series · 25 of 48 positions shown · non-contrast
Comparison: MRI from the same day and MRA from [DATE]

CLINICAL DATA: Stroke follow-up.

EXAM:
MRA HEAD WITHOUT CONTRAST
TECHNIQUE: Angiographic images of the Circle of Willis were obtained using MRA
technique without intravenous contrast.

[Series 5: 3d cow · axial · 0.5mm · 0.41mm/px · z∈[-75,+18]mm · 25 of 204 slices shown]
[im 1/204]
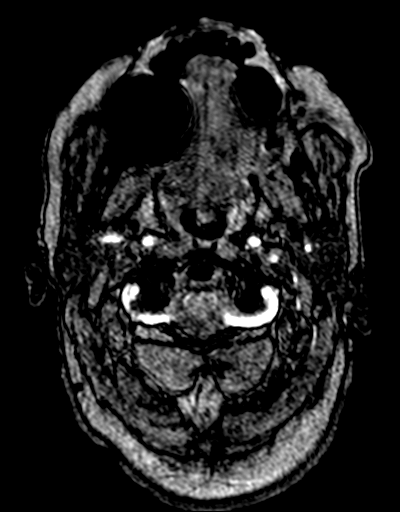
[im 5/204]
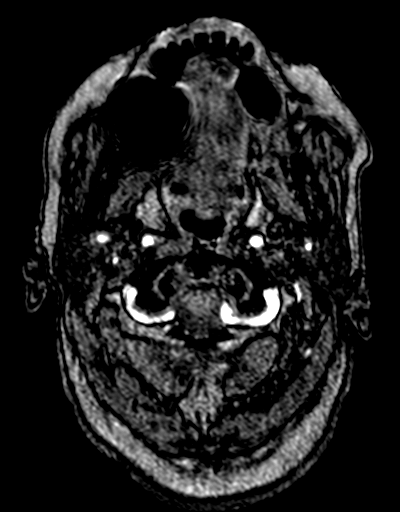
[im 9/204]
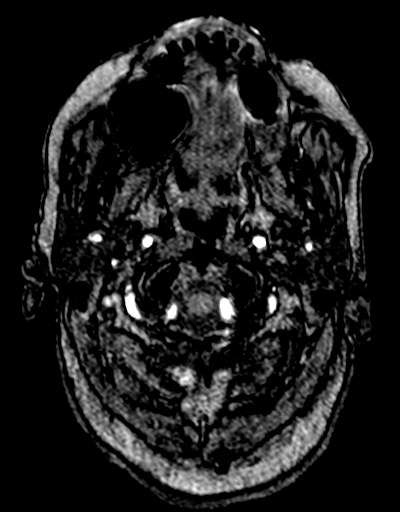
[im 13/204]
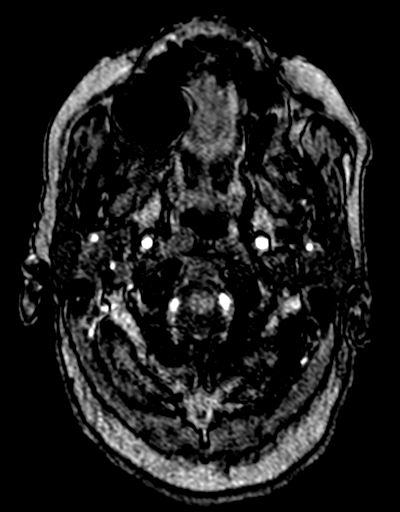
[im 18/204]
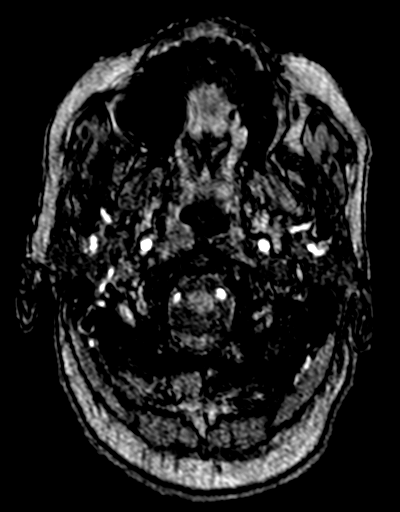
[im 22/204]
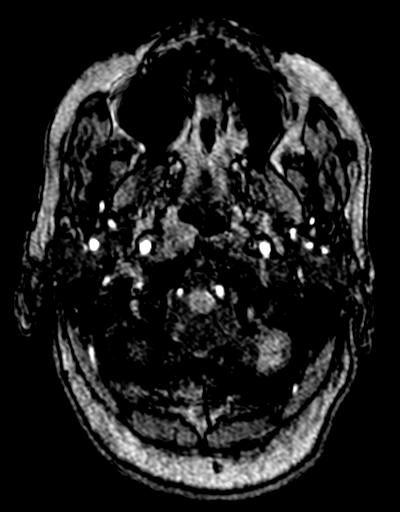
[im 26/204]
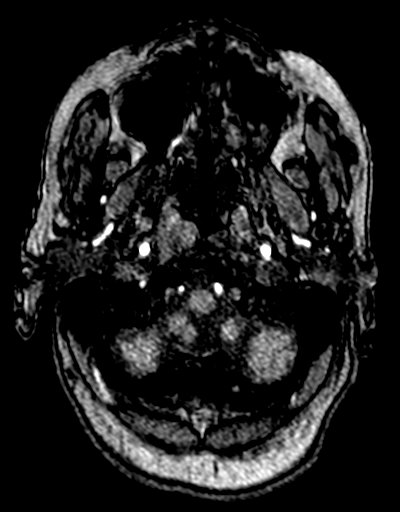
[im 31/204]
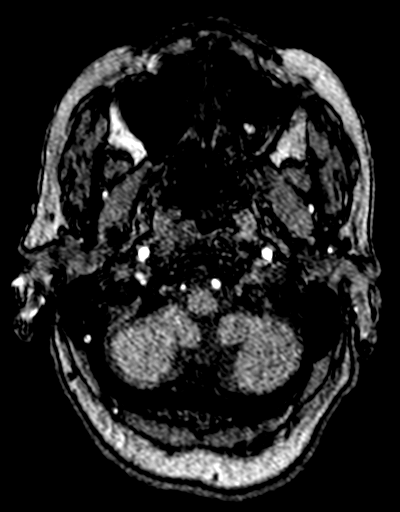
[im 35/204]
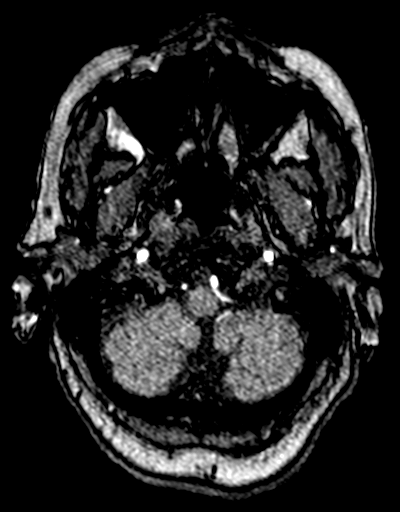
[im 39/204]
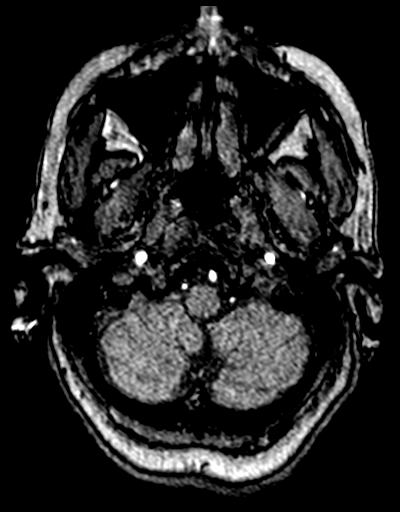
[im 44/204]
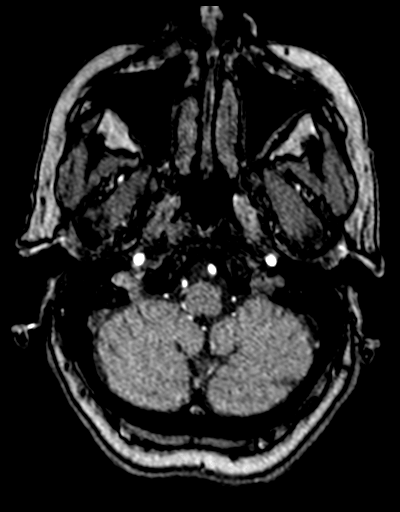
[im 48/204]
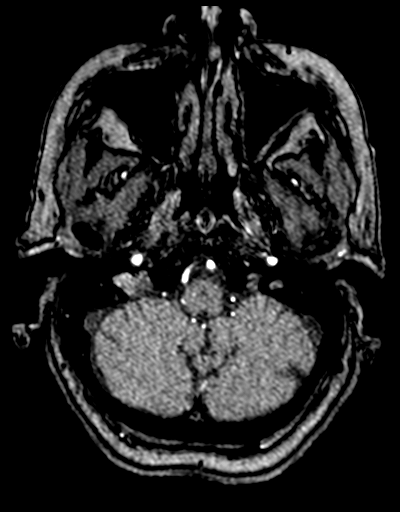
[im 52/204]
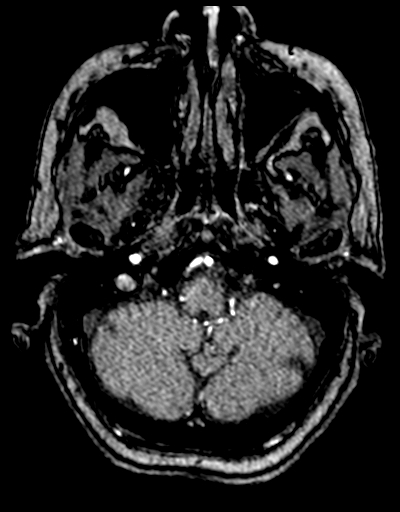
[im 57/204]
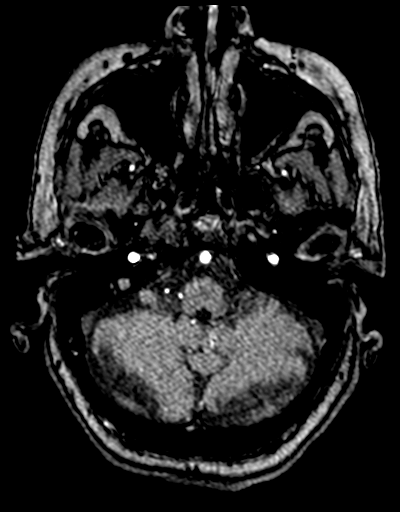
[im 61/204]
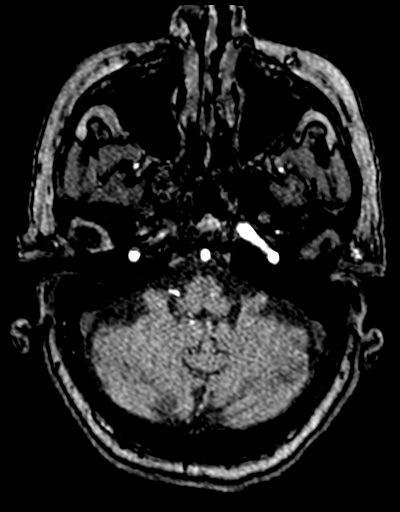
[im 65/204]
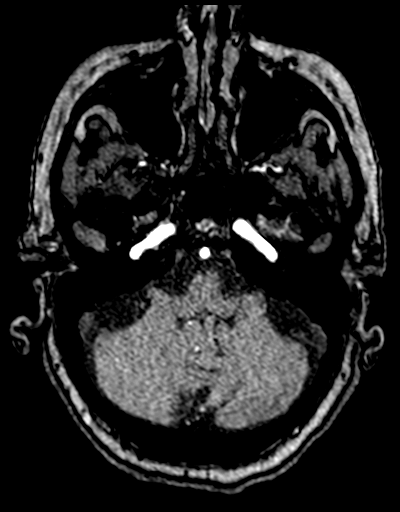
[im 70/204]
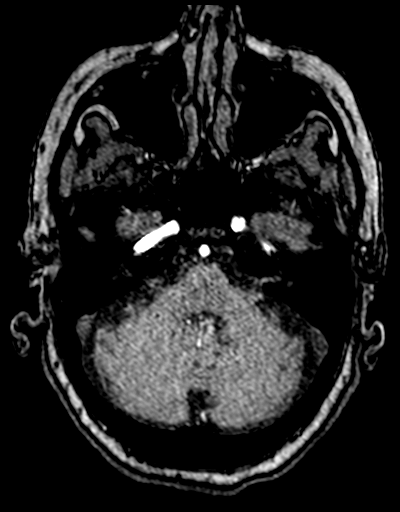
[im 74/204]
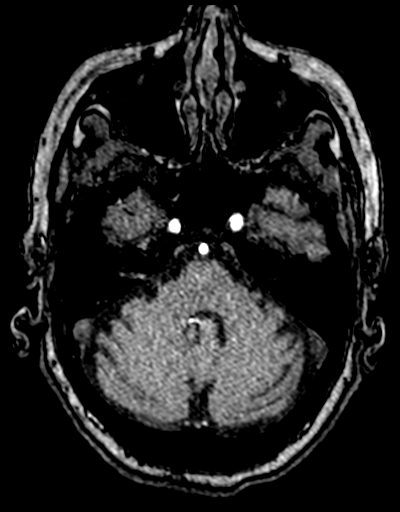
[im 91/204]
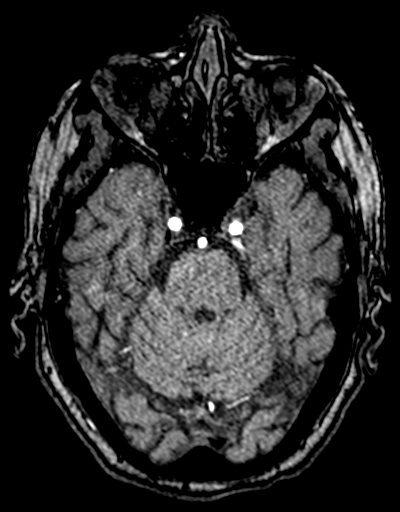
[im 104/204]
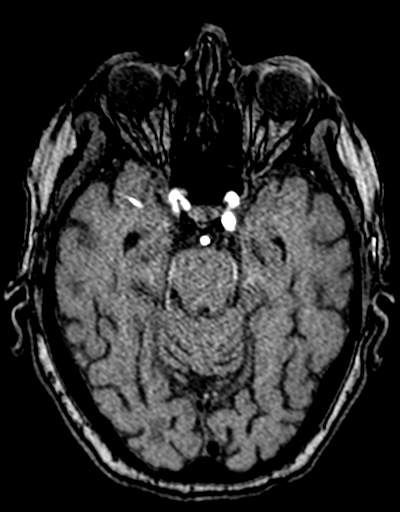
[im 117/204]
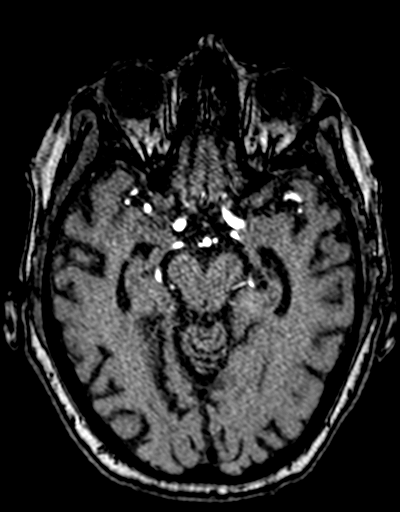
[im 143/204]
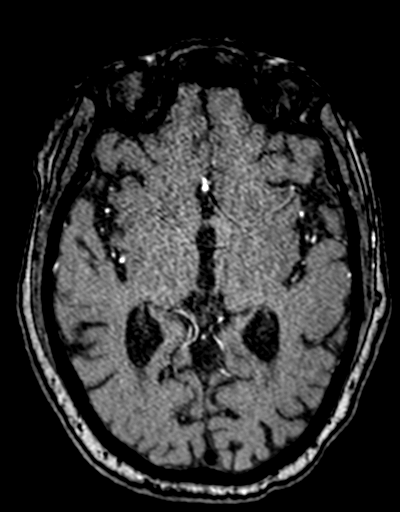
[im 169/204]
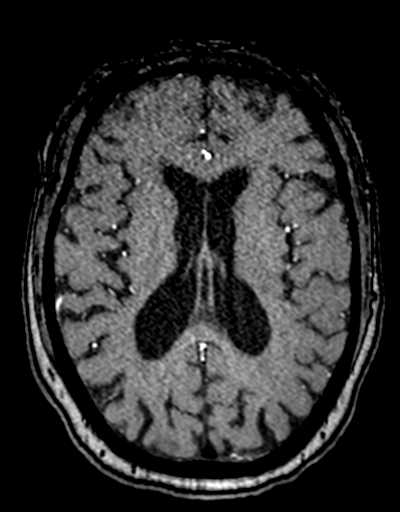
[im 173/204]
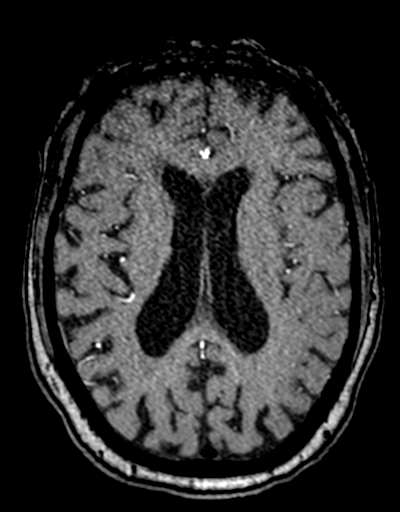
[im 195/204]
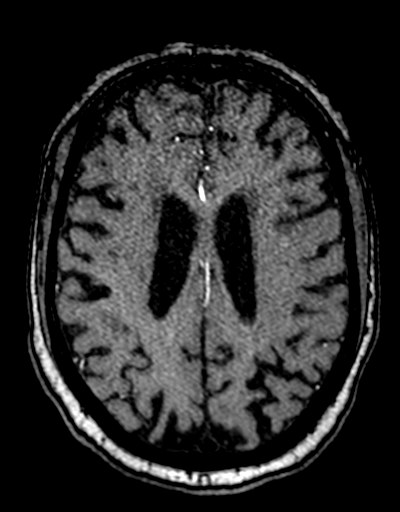

[25 of 48 positions shown; findings below may reference images not displayed]

FINDINGS: Similar appearance of the anterior and posterior circulation
comparison to prior MRA from [DATE].

No evidence of hemodynamically significant stenosis or large vessel
occlusion involving the anterior posterior circulation. Mild
irregularity of distal vessels likely relates to atherosclerosis. No
evidence of aneurysm or vascular malformation.
IMPRESSION: No evidence of proximal hemodynamically significant stenosis or
large vessel occlusion.Similar appearance of the intracranial
vessels in comparison to prior MRA from [DATE].

## 2020-01-07 IMAGING — MR MR HEAD W/O CM
12 of 13 series · 44 of 48 positions shown · non-contrast
Comparison: MRI [DATE]

CLINICAL DATA: Neuro deficit, acute stroke suspected.

EXAM:
MRI HEAD WITHOUT CONTRAST
TECHNIQUE: Multiplanar, multiecho pulse sequences of the brain and surrounding
structures were obtained without intravenous contrast.

[Series 5: DWI · axial · 3.0mm · 0.88mm/px · z∈[-78,+59]mm · 8 of 100 slices shown (1 of 4)]
[im 1/100]
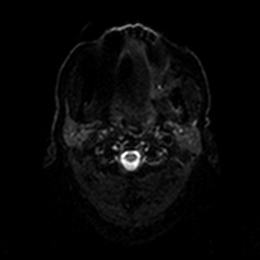
[im 15/100]
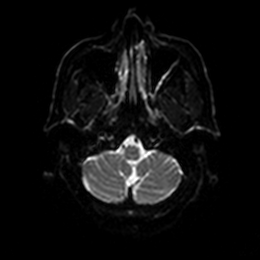
[im 29/100]
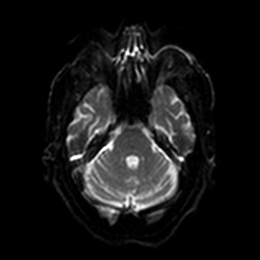
[im 43/100]
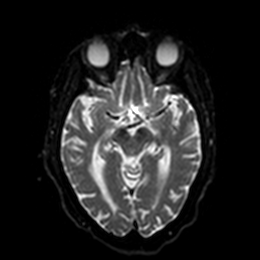
[im 57/100]
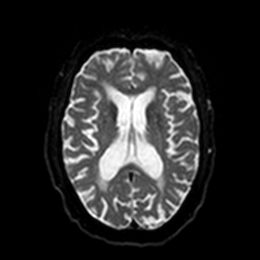
[im 71/100]
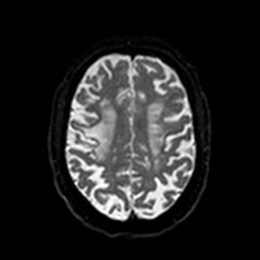
[im 85/100]
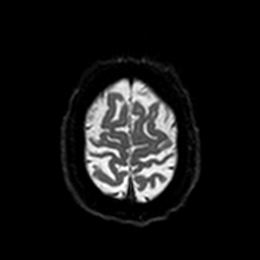
[im 100/100]
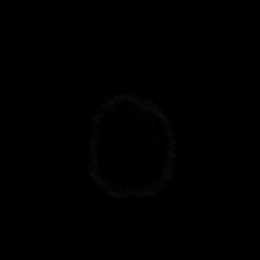

[Series 6: DWI · axial · 3.0mm · 0.88mm/px · z∈[-78,+59]mm · 4 of 50 slices shown (2 of 4)]
[im 1/50]
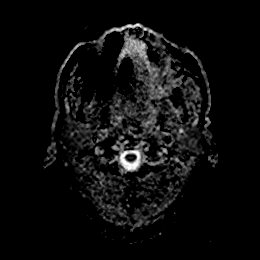
[im 17/50]
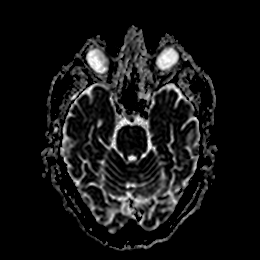
[im 33/50]
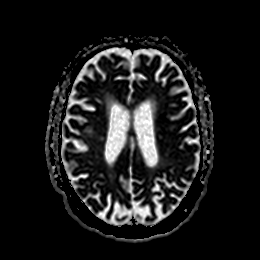
[im 50/50]
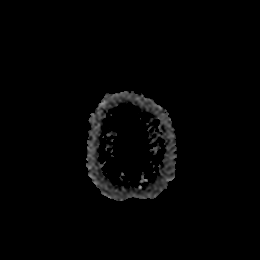

[Series 7: DWI · coronal · 4.0mm · 0.88mm/px · 5 of 68 slices shown (3 of 4)]
[im 1/68]
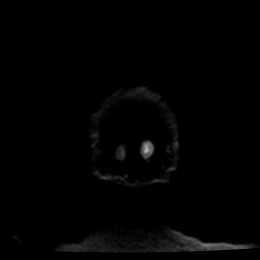
[im 17/68]
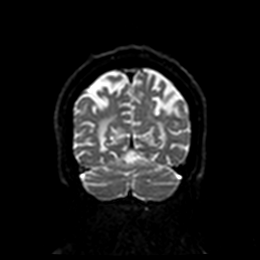
[im 34/68]
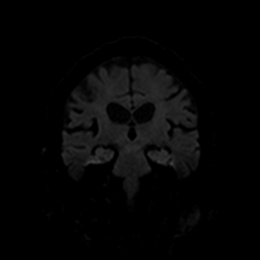
[im 51/68]
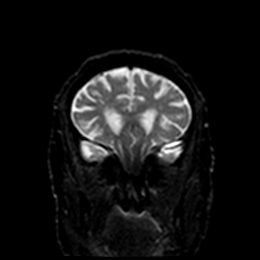
[im 68/68]
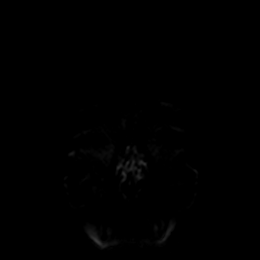

[Series 8: DWI · coronal · 4.0mm · 0.88mm/px · 3 of 34 slices shown (4 of 4)]
[im 1/34]
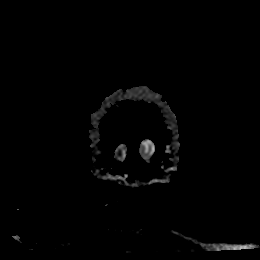
[im 17/34]
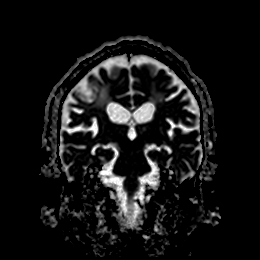
[im 34/34]
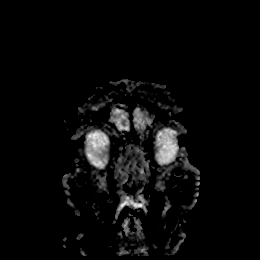

[Series 9: mag_images · axial · 3.0mm · 0.90mm/px · z∈[-92,+73]mm · 4 of 60 slices shown]
[im 1/60]
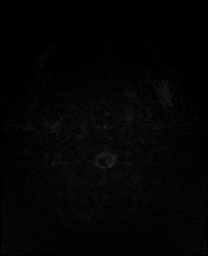
[im 20/60]
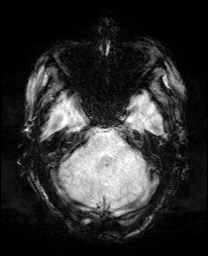
[im 40/60]
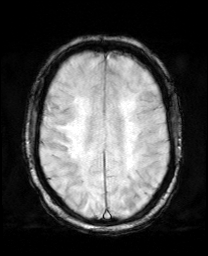
[im 60/60]
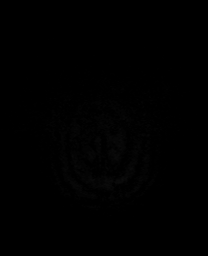

[Series 10: pha_images · axial · 3.0mm · 0.90mm/px · z∈[-89,+73]mm · 4 of 59 slices shown]
[im 1/59]
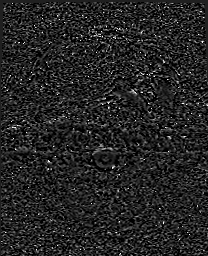
[im 20/59]
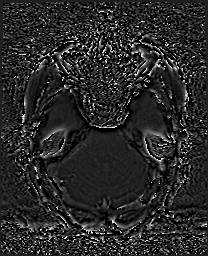
[im 39/59]
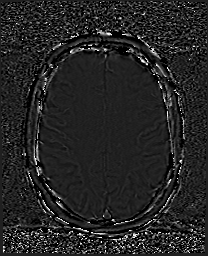
[im 59/59]
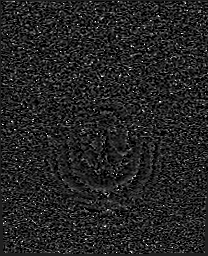

[Series 11: swi_images · axial · 3.0mm · 0.90mm/px · z∈[-92,+73]mm · 4 of 60 slices shown]
[im 1/60]
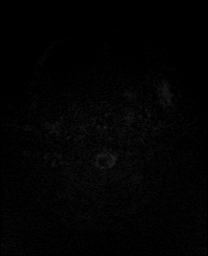
[im 20/60]
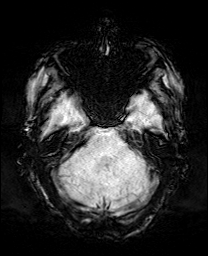
[im 40/60]
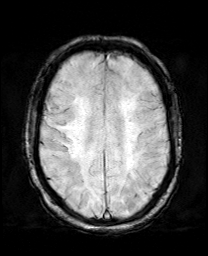
[im 60/60]
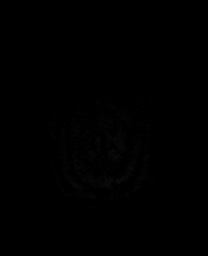

[Series 12: mip_images(sw) · axial · 24.0mm · 0.90mm/px · z∈[-82,+63]mm · 4 of 53 slices shown]
[im 1/53]
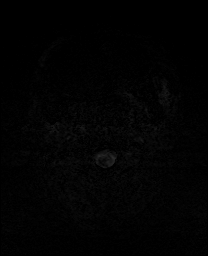
[im 18/53]
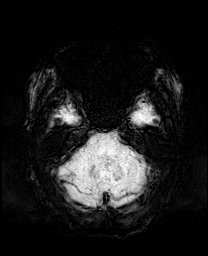
[im 35/53]
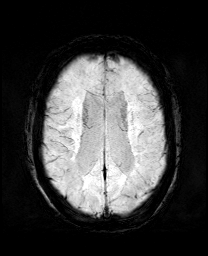
[im 53/53]
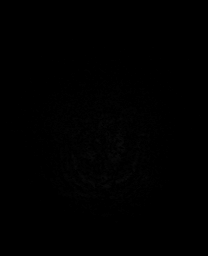

[Series 13: FLAIR · axial · 5.0mm · 0.45mm/px · z∈[-76,+58]mm · 2 of 25 slices shown]
[im 1/25]
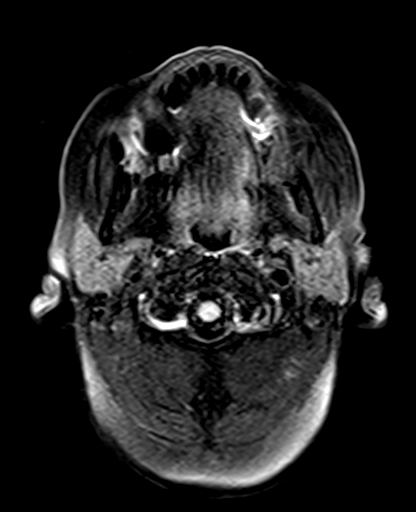
[im 25/25]
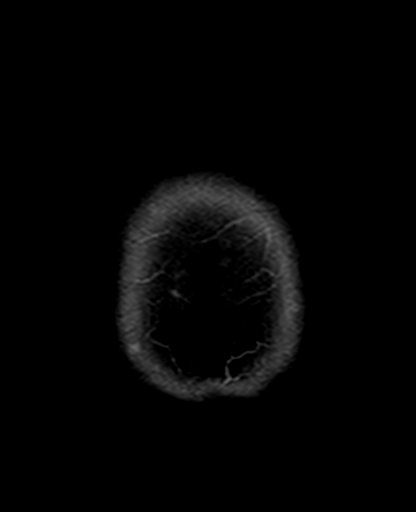

[Series 14: T1 · sagittal · 5.0mm · 0.75mm/px · 2 of 25 slices shown]
[im 1/25]
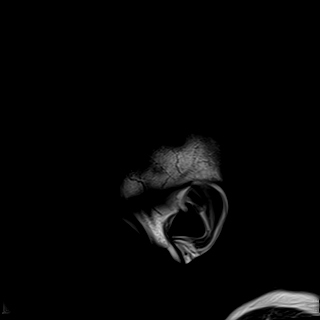
[im 25/25]
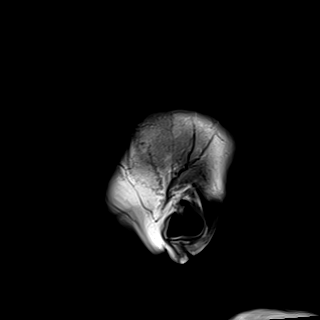

[Series 15: T2 · axial · 5.0mm · 0.72mm/px · z∈[-82,+64]mm · 2 of 27 slices shown (1 of 2)]
[im 1/27]
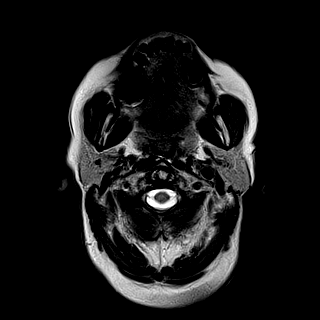
[im 27/27]
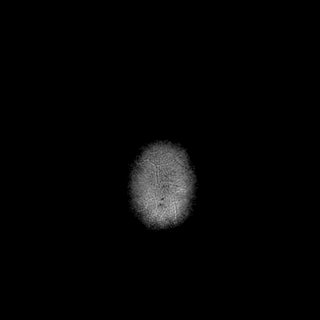

[Series 17: T2 · coronal · 5.0mm · 0.34mm/px · 2 of 29 slices shown (2 of 2)]
[im 1/29]
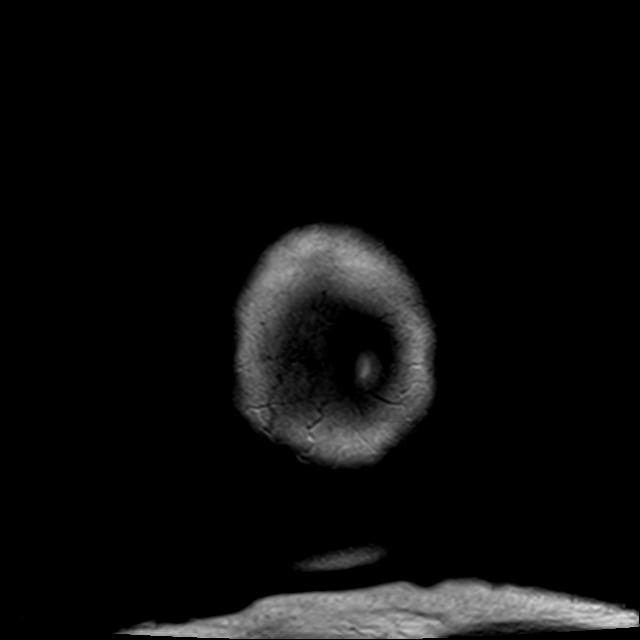
[im 29/29]
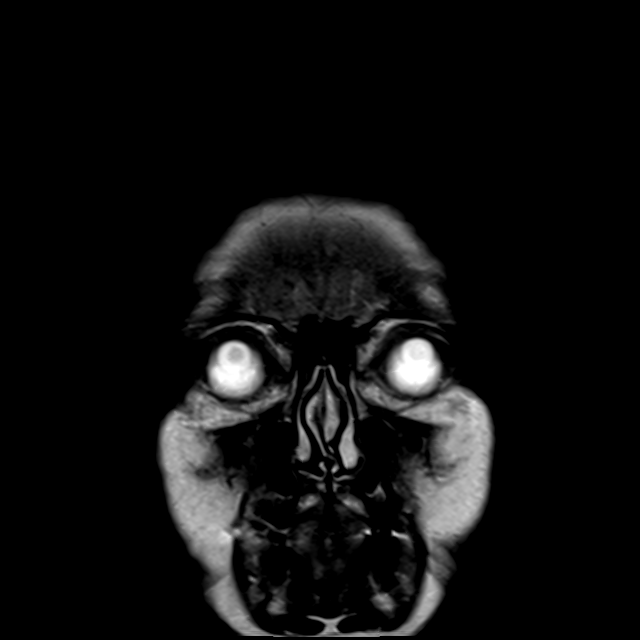

[44 of 48 positions shown; findings below may reference images not displayed]

FINDINGS: Brain: There is an acute infarct involving the high left frontal
cortex. Additional punctate infarcts involving the posterior right
parietal lobe (series 5, image 84), the posterior left temporal lobe
(see series 5, images 68 and 69), and right frontal cortex (series
5, image 86). There is mild edema associated with the left frontal
infarct without substantial mass effect. No midline shift. No
evidence of acute hemorrhage. Encephalomalacia associated with prior
right frontoparietal infarct. Additional scattered T2/FLAIR
hyperintensities are compatible with the sequela of prior
microvascular ischemic disease. Small remote lacunar infarcts in
bilateral cerebellar hemispheres. No mass lesion or abnormal mass
effect. No hydrocephalus.

Vascular: Proximal major arterial flow voids are maintained at the
skull base.

Skull and upper cervical spine: Normal marrow signal.

Sinuses/Orbits: Scattered mucosal thickening without air-fluid
levels in the sinuses. Negative orbits.

Other: Small right greater than left mastoid effusions.
IMPRESSION: 1. Acute high left frontal cortical infarct with additional punctate
infarcts involving the right parietal lobe, right frontal lobe, and
posterior left temporal lobe, as detailed above. Involvement of
multiple vascular territories suggests a central embolic etiology.
2. Remote infarcts and chronic microvascular ischemic disease, as
described above.

Findings discussed with Dr. GRAF via telephone at [DATE] p.m.

## 2020-01-07 MED ORDER — ENOXAPARIN SODIUM 40 MG/0.4ML ~~LOC~~ SOLN
40.0000 mg | SUBCUTANEOUS | Status: DC
  Administered 2020-01-07 – 2020-01-09 (×3): 40 mg via SUBCUTANEOUS
  Filled 2020-01-07 (×3): qty 0.4

## 2020-01-07 MED ORDER — ACETAMINOPHEN 325 MG PO TABS
650.0000 mg | ORAL_TABLET | ORAL | Status: DC | PRN
  Administered 2020-01-08: 650 mg via ORAL
  Filled 2020-01-07: qty 2

## 2020-01-07 MED ORDER — SODIUM CHLORIDE 0.9 % IV SOLN: INTRAVENOUS | Status: DC

## 2020-01-07 MED ORDER — ASPIRIN EC 81 MG PO TBEC
81.0000 mg | DELAYED_RELEASE_TABLET | Freq: Every day | ORAL | Status: DC
  Administered 2020-01-08 – 2020-01-10 (×3): 81 mg via ORAL
  Filled 2020-01-07 (×3): qty 1

## 2020-01-07 MED ORDER — SODIUM CHLORIDE 0.9% FLUSH
3.0000 mL | Freq: Once | INTRAVENOUS | Status: AC
  Administered 2020-01-07: 3 mL via INTRAVENOUS

## 2020-01-07 MED ORDER — PANTOPRAZOLE SODIUM 40 MG PO TBEC
40.0000 mg | DELAYED_RELEASE_TABLET | Freq: Every day | ORAL | Status: DC
  Administered 2020-01-08 – 2020-01-10 (×3): 40 mg via ORAL
  Filled 2020-01-07 (×3): qty 1

## 2020-01-07 MED ORDER — ATORVASTATIN CALCIUM 40 MG PO TABS
40.0000 mg | ORAL_TABLET | Freq: Every day | ORAL | Status: DC
  Administered 2020-01-08 – 2020-01-10 (×3): 40 mg via ORAL
  Filled 2020-01-07 (×3): qty 1

## 2020-01-07 MED ORDER — ACETAMINOPHEN 160 MG/5ML PO SOLN: 650.0000 mg | ORAL | Status: DC | PRN

## 2020-01-07 MED ORDER — STROKE: EARLY STAGES OF RECOVERY BOOK
Freq: Once | Status: AC
  Filled 2020-01-07 (×2): qty 1

## 2020-01-07 MED ORDER — SENNOSIDES-DOCUSATE SODIUM 8.6-50 MG PO TABS: 1.0000 | ORAL_TABLET | Freq: Every evening | ORAL | Status: DC | PRN

## 2020-01-07 MED ORDER — ACETAMINOPHEN 650 MG RE SUPP: 650.0000 mg | RECTAL | Status: DC | PRN

## 2020-01-07 NOTE — ED Notes (Signed)
IV access attempted by this RN and Optician, dispensing. Unable to access. IV team consulted. At bedside at this time.

## 2020-01-07 NOTE — Consult Note (Addendum)
NEURO HOSPITALIST  CONSULT   Requesting Physician: Dr. Madilyn Hook    Chief Complaint: aphasia  History obtained from:  Family  HPI:                                                                                                                                         Shannon Nixon is an 86 y.o. 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  Per family patient was normal at 1600. She went into the bathroom and when she came out her speech was garbled. They called EMS because these were same symptoms of a past stroke. Denies any blood thinners or atrial fibrillation. At baseline patient is unable to perform ADL's independently, she wears incontinence briefs, and is unable to be left alone without a caregiver. Has also been exhibiting memory loss.  EMS evaluated the patient, she showed evidence of aphasia, code stroke was activated and she was brought in for emergent evaluation.  On the bridge, NIH stroke scale 3-detailed below. CT head with question of acute/subacute hypodensities over chronic strokes but nothing for sure. Taken for stat MRI that reveals embolic strokes.  ED course:  CTH: no hemorrhage BP162/72 BG: 120 MRI: final read pending:  Chart review of past stroke:  09/29/2018 was seen at [facility] MRI: 15-20 micro embolic infarcts affecting cerebellum and both cerebral hemispheres consistent with embolic disease.  Date last known well: 01/07/20 Time last known well: 1600 tPA Given:no; too mild to treat,       Modified Rankin: Rankin Score=3 NIHSS:3   No past medical history on file.  Past Surgical History:  Procedure Laterality Date  . KNEE ARTHROSCOPY      No family history on file.       Social History:  reports that  she has quit smoking. She has never used smokeless tobacco. She reports previous alcohol use. She reports that she does not use drugs.  Allergies: No Known Allergies  Medications:  Current Facility-Administered Medications  Medication Dose Route Frequency Provider Last Rate Last Admin  . sodium chloride flush (NS) 0.9 % injection 3 mL  3 mL Intravenous Once Tilden Fossa, MD       Current Outpatient Medications  Medication Sig Dispense Refill  . cyclobenzaprine (FLEXERIL) 5 MG tablet Take 1 tablet (5 mg total) by mouth 3 (three) times daily as needed for muscle spasms. 10 tablet 0  . traMADol (ULTRAM) 50 MG tablet Take 1 tablet (50 mg total) by mouth every 6 (six) hours as needed. 10 tablet 0     ROS:                                                                                                                                        unobtainable from patient due to aphasia   General Examination:                                                                                                      There were no vitals taken for this visit.  Physical Exam  Constitutional: Appears well-developed and well-nourished.  Psych: Affect appropriate to situation Eyes: Normal external eye and conjunctiva. HENT: Normocephalic, no lesions, without obvious abnormality.   Musculoskeletal-no joint tenderness, deformity or swelling Cardiovascular: Normal rate and regular rhythm.  Respiratory: Effort normal, non-labored breathing saturations WNL GI: Soft.  No distension. There is no tenderness.  Skin: WDI  Neurological Examination Mental Status: Alert, able to follow some simple commands. Expressive aphasia.  Cranial Nerves: II:  Visual fields grossly normal,  III,IV, VI: ptosis not present, extra-ocular motions intact bilaterally, pupils equal, round, reactive to light and  accommodation V,VII: smile symmetric, facial light touch sensation normal bilaterally VIII: hearing normal bilaterally IX,X: uvula rises midline XI: bilateral shoulder shrug XII: midline tongue extension Motor: Able to raise all 4 extremities anti-gravity. Tone and bulk:normal tone throughout; no atrophy noted Sensory: intact to light touch Cerebellar: No gross ataxia noted Gait: deferred   Lab Results: Basic Metabolic Panel: Recent Labs  Lab 01/07/20 1722  NA 139  K 4.4  CL 106  GLUCOSE 106*  BUN 25*  CREATININE 1.40*    CBC: Recent Labs  Lab 01/07/20 1722 01/07/20 1723  WBC  --  10.3  NEUTROABS  --  6.2  HGB 12.9 12.8  HCT 38.0 40.3  MCV  --  99.8  PLT  --  181   Imaging: CT HEAD CODE STROKE WO CONTRAST  Result Date:  01/07/2020 CLINICAL DATA:  Code stroke. Neuro deficit, acute stroke suspected. Slurred speech and dysphagia. EXAM: CT HEAD WITHOUT CONTRAST TECHNIQUE: Contiguous axial images were obtained from the base of the skull through the vertex without intravenous contrast. COMPARISON:  MRI and CT from a 08/26/2018 FINDINGS: Brain: Slight progression of hypoattenuation in the high right frontoparietal region, in the region of infarcts seen on prior MRI. There is suggestion of volume loss in this region additional patchy areas of white matter attenuation are similar to prior and likely represent the sequela of chronic microvascular ischemic disease or prior small infarcts. No acute hemorrhage. Vascular: No hyperdense vessel or unexpected calcification. Skull: No acute fracture. Sinuses/Orbits: Opacification a left posterior ethmoid air cell. Scattered mucosal thickening. Unremarkable orbits. Other: No mastoid effusions. IMPRESSION: 1. Slight progression of hypoattenuation in the high right frontoparietal region. This is favored to represent evolution of infarcts seen in this region on the prior MRI given suggestion of associated volume loss; however, superimposed  new/acute infarct is difficult to exclude. An MRI could further evaluate if clinically indicated. 2. No acute hemorrhage. Code stroke imaging results were communicated on 01/07/2020 at 5:35 pm to provider Dr. Wilford Corner via telephone, who verbally acknowledged these results. Electronically Signed   By: Feliberto Harts MD   On: 01/07/2020 17:39     Valentina Lucks, MSN, NP-C [facility] [phone]  01/07/2020, 5:43 PM   Attending physician note to follow with Assessment and plan .   Attending addendum Patient seen and examined as an acute code stroke at the request of Dr. Olivia Canter in the ED Briefly, 86 year old with past history of hypertension hyperlipidemia stroke with not much in terms of residual deficits, dementia, deconditioning requiring 24/7 help at home, presented for sudden onset aphasia. NIH stroke scale 3 on my examination-2 for questions in 1 for aphasia. She only exhibited expressive aphasia, was able to follow all commands. Speech was complete word salad. No focal cranial nerve or motor or sensory deficits. CT head unremarkable for acute process Stat MRI performed. See discussion below  I have personally reviewed imaging.   Assessment: 86 y.o. female with PMH HLD, HTN, CVA who presented to [facility] ED as a code stroke for slurred speech and aphasia. CTH was negative for hemorrhage.  Taken in for stat MRI-that revealed scattered embolic-looking stroke in bilateral hemispheres with the biggest one being in the left frontal area. Due to to the low stroke scale,TPA was not used. Strong suspicion of cardioembolic process-because the prior stroke was also scattered embolic-looking.  Although no preceding illness or sickness reported, will check for any vegetations. Has already had Zio patch monitoring outpatient-might benefit from loop recorder.  Stroke work-up recommended as below. Stroke Risk Factors - hyperlipidemia and hypertension    Recommendations: --  BP goal : Permissive HTN upto 220/110 mmHg (for 24-48 post admission ) goal is to normalize BP < 140/90 by discharge. --MRI Brain  --Blood cultures -- Carotid US --MRA head --2D echocardiogram --Aspirin 81 (usually I would hold aspirin if there is a strong suspicion for a vegetation or infective endocarditis but in her case suspicion is low, so I would go ahead and continue aspirin) -- High intensity Statin if LDL > 70 -- HgbA1c, fasting lipid panel -- PT consult, OT consult, Speech consult --Telemetry monitoring --Frequent neuro checks --Stroke swallow screen  --Prior work-up at [facility] in 2020 did include Zio patch, might benefit from loop recorder.  Remains in the TPA window till 8:30 PM-should her NIH  score worsen, at that time can consider TPA as last known normal was 4 PM today.  This was communicated to the patient's bedside RN as well as rapid response RN.  Plan discussed with Dr. Madilyn Hookees in the ED as well as patient's daughter-in-law at bedside.  --Please page the Stroke team from 8am-4pm.   You can look them up on [email or web address]    -- Milon DikesAshish Geovanni Rahming, MD [facility] Pager: [phone] If 7pm to 7am, please call on call as listed on AMION.  CRITICAL CARE ATTESTATION Performed by: Milon DikesAshish Emily Forse, MD Total critical care time: 60 minutes Critical care time was exclusive of separately billable procedures and treating other patients and/or supervising APPs/Residents/Students Critical care was necessary to treat or prevent imminent or life-threatening deterioration due to acute ischemic stroke This patient is critically ill and at significant risk for neurological worsening and/or death and care requires constant monitoring. Critical care was time spent personally by me on the following activities: development of treatment plan with patient and/or surrogate as well as nursing, discussions with consultants, evaluation of patient's response to treatment, examination of  patient, obtaining history from patient or surrogate, ordering and performing treatments and interventions, ordering and review of laboratory studies, ordering and review of radiographic studies, pulse oximetry, re-evaluation of patient's condition, participation in multidisciplinary rounds and medical decision making of high complexity in the care of this patient.

## 2020-01-07 NOTE — H&P (Signed)
History and Physical   Shannon Nixon VOZ:[id number] DOB: 22-Apr-1933 DOA: 01/07/2020  PCP: Patient, No Pcp Per [facility]  Patient coming from: Home  Chief Complaint: Expressive aphasia  HPI: Shannon Nixon is a 86 y.o. female with medical history significant of prior strokes most recently in 2020 at [facility], hypertension, OA, hyperlipidemia, CKD, adjustment disorder with depressed mood, and adjustment insomnia presented following onset of aphasia at home.  Patient unable to significantly participate in exam due to her aphasia history obtained via chart review and with the assistance of the patient's daughter.  Patient was last seen normal around 4 PM she evidently went to the bathroom and came out with garbled speech and aphasia.  Because of her prior strokes the family promptly called EMS and patient was transferred to the ED.  On arrival she was evaluated by neurology and due to her NIH score of 3 it was determined that she did not need TPA at this time.  No apparent history of recent illness.  ED Course: Vital signs stable in ED.  Labs showed creatinine of 1.44 possibly consistent with her CKD prior baselines of 1.2-1.8.  LFTs, CBC, PT/INR and PTT all within normal limits.  EKG showed sinus rhythm.  CT head showed evolution of prior infarcts with possibility of new infarct, negative for acute hemorrhage.  MRI brain showed acute high left frontal cortical infarct with additional punctate infarcts in various lobes suggestive of embolic etiology.  Neurology saw patient as a code stroke in the ED and are following.  Review of Systems: Patient unable to fully participate in review of symptoms due to her aphasia  No past medical history on file.  Past Surgical History:  Procedure Laterality Date  . KNEE ARTHROSCOPY     Social History  reports that she has quit smoking. She has never used smokeless tobacco. She reports previous alcohol use. She  reports that she does not use drugs.  No Known Allergies  Family History  Problem Relation Age of Onset  . Heart disease Father     Prior to Admission medications   Medication Sig Start Date End Date Taking? Authorizing Provider  cyclobenzaprine (FLEXERIL) 5 MG tablet Take 1 tablet (5 mg total) by mouth 3 (three) times daily as needed for muscle spasms. 02/06/18   Charlynne Pander, MD  traMADol (ULTRAM) 50 MG tablet Take 1 tablet (50 mg total) by mouth every 6 (six) hours as needed. 02/06/18   Charlynne Pander, MD    Physical Exam: Vitals:   01/07/20 1821 01/07/20 1824  BP:  (!) 156/81  Pulse:  86  Resp:  20  Temp:  99.4 F (37.4 C)  TempSrc:  Oral  SpO2:  93%  Weight: 74.3 kg     Physical Exam Constitutional:      General: She is not in acute distress.    Appearance: Normal appearance.     Comments: Elderly Female  HENT:     Head: Normocephalic and atraumatic.     Mouth/Throat:     Mouth: Mucous membranes are moist.     Pharynx: Oropharynx is clear.  Eyes:     Extraocular Movements: Extraocular movements intact.     Pupils: Pupils are equal, round, and reactive to light.  Cardiovascular:     Rate and Rhythm: Normal rate and regular rhythm.     Pulses: Normal pulses.     Heart sounds: Normal heart sounds.  Pulmonary:     Effort: Pulmonary  effort is normal. No respiratory distress.     Breath sounds: Normal breath sounds.  Abdominal:     General: Bowel sounds are normal. There is no distension.     Palpations: Abdomen is soft.     Tenderness: There is no abdominal tenderness.  Musculoskeletal:        General: No swelling or deformity.  Skin:    General: Skin is warm and dry.  Neurological:     General: No focal deficit present.     Mental Status: Mental status is at baseline.     Comments: Mental Status: Patient is awake, alert, following commands Expressive aphasia Cranial Nerves: II: Pupils equal, round, and reactive to light.  III,IV, VI: EOMI  without ptosis or diploplia.  V: Facial sensation is symmetric tolight touch VII: Facial movement is symmetric.  VIII: hearing is intact to voice X: Uvula elevates symmetrically XI: Shoulder shrug is symmetric. XII: tongue is midline without atrophy or fasciculations.  Motor: good effort thorughout, at Least 4-5/5 bilateral UE, 4-5/5 bilateral lower extremitiy Sensory: Sensation is grossly intact bilateral UEs & LEs  Cerebellar: Finger-Nose intact on R with some difficulty on L (possible due to prior shoulder and arm injuries.    Labs on Admission: I have personally reviewed following labs and imaging studies  CBC: Recent Labs  Lab 01/07/20 1722 01/07/20 1723  WBC  --  10.3  NEUTROABS  --  6.2  HGB 12.9 12.8  HCT 38.0 40.3  MCV  --  99.8  PLT  --  181    Basic Metabolic Panel: Recent Labs  Lab 01/07/20 1722 01/07/20 1723  NA 139 138  K 4.4 4.3  CL 106 104  CO2  --  21*  GLUCOSE 106* 107*  BUN 25* 24*  CREATININE 1.40* 1.44*  CALCIUM  --  9.7    GFR: CrCl cannot be calculated (Unknown ideal weight.).  Liver Function Tests: Recent Labs  Lab 01/07/20 1723  AST 33  ALT 17  ALKPHOS 80  BILITOT 1.3*  PROT 7.1  ALBUMIN 4.3    Urine analysis: No results found for: COLORURINE, APPEARANCEUR, LABSPEC, PHURINE, GLUCOSEU, HGBUR, BILIRUBINUR, KETONESUR, PROTEINUR, UROBILINOGEN, NITRITE, LEUKOCYTESUR  Radiological Exams on Admission: MR BRAIN WO CONTRAST  Result Date: 01/07/2020 CLINICAL DATA:  Neuro deficit, acute stroke suspected. EXAM: MRI HEAD WITHOUT CONTRAST TECHNIQUE: Multiplanar, multiecho pulse sequences of the brain and surrounding structures were obtained without intravenous contrast. COMPARISON:  MRI August 26, 2018 FINDINGS: Brain: There is an acute infarct involving the high left frontal cortex. Additional punctate infarcts involving the posterior right parietal lobe (series 5, image 84), the posterior left temporal lobe (see series 5, images 68 and 69),  and right frontal cortex (series 5, image 86). There is mild edema associated with the left frontal infarct without substantial mass effect. No midline shift. No evidence of acute hemorrhage. Encephalomalacia associated with prior right frontoparietal infarct. Additional scattered T2/FLAIR hyperintensities are compatible with the sequela of prior microvascular ischemic disease. Small remote lacunar infarcts in bilateral cerebellar hemispheres. No mass lesion or abnormal mass effect. No hydrocephalus. Vascular: Proximal major arterial flow voids are maintained at the skull base. Skull and upper cervical spine: Normal marrow signal. Sinuses/Orbits: Scattered mucosal thickening without air-fluid levels in the sinuses. Negative orbits. Other: Small right greater than left mastoid effusions. IMPRESSION: 1. Acute high left frontal cortical infarct with additional punctate infarcts involving the right parietal lobe, right frontal lobe, and posterior left temporal lobe, as detailed above. Involvement of  multiple vascular territories suggests a central embolic etiology. 2. Remote infarcts and chronic microvascular ischemic disease, as described above. Findings discussed with Dr. Wilford Corner via telephone at 6:21 p.m. Electronically Signed   By: Feliberto Harts MD   On: 01/07/2020 18:23   CT HEAD CODE STROKE WO CONTRAST  Result Date: 01/07/2020 CLINICAL DATA:  Code stroke. Neuro deficit, acute stroke suspected. Slurred speech and dysphagia. EXAM: CT HEAD WITHOUT CONTRAST TECHNIQUE: Contiguous axial images were obtained from the base of the skull through the vertex without intravenous contrast. COMPARISON:  MRI and CT from a 08/26/2018 FINDINGS: Brain: Slight progression of hypoattenuation in the high right frontoparietal region, in the region of infarcts seen on prior MRI. There is suggestion of volume loss in this region additional patchy areas of white matter attenuation are similar to prior and likely represent the  sequela of chronic microvascular ischemic disease or prior small infarcts. No acute hemorrhage. Vascular: No hyperdense vessel or unexpected calcification. Skull: No acute fracture. Sinuses/Orbits: Opacification a left posterior ethmoid air cell. Scattered mucosal thickening. Unremarkable orbits. Other: No mastoid effusions. IMPRESSION: 1. Slight progression of hypoattenuation in the high right frontoparietal region. This is favored to represent evolution of infarcts seen in this region on the prior MRI given suggestion of associated volume loss; however, superimposed new/acute infarct is difficult to exclude. An MRI could further evaluate if clinically indicated. 2. No acute hemorrhage. Code stroke imaging results were communicated on 01/07/2020 at 5:35 pm to provider Dr. Wilford Corner via telephone, who verbally acknowledged these results. Electronically Signed   By: Feliberto Harts MD   On: 01/07/2020 17:39   EKG: Independently reviewed.  Sinus rhythm  Assessment/Plan Active Problems:   Acute CVA (cerebrovascular accident) (HCC)   History of embolic stroke   Benign essential hypertension   Stage 3b chronic kidney disease (HCC)   Pure hypercholesterolemia   Primary osteoarthritis of right hip  Acute CVA Hyperlipidemia History of prior CVA > Present with aphasia, last normmal at 4pm > No TPA as NIH score is 3 (per neuro), if worsens before 8:30pm, contact neurology for consideration of TPA  - Neurology consulted in ED and are following - Allow for permissive HTN in the setting of systolic < 220 and diastolic < 120  (If given tPA systolic <185/105 in the first 24 hrs following thrombolytic therapy) - ASA 325 mg / 81 mg daily  - Continue home atorvastatin 40 mg daily - Echocardiogram  - Carotid doppler - MRA  - A1C  - Lipid panel  - Tele monitoring  - SLP eval - PT/OT  Hypertension - Hold home losartan  Adjustment disorder with depression and insomnia - Hold home mirtazapine, trazodone,  gabapentin  CKD > Creatinine 1.4 on admission, baseline in Care Everywhere 1.2-1.8. - Follow renal function - Gentle maintenance fluids  GERD - Continue home PPI  DVT prophylaxis: Lovenox  Code Status:   Full  Family Communication:  Discussed with daughter at bedside  Disposition Plan:   Patient is from:  Home  Anticipated DC to:  Pending clinical course  Anticipated DC date:  Pending work-up   Consults called:  Neurology consulted in the ED Dr. Jerrell Belfast  Admission status:  Inpatient, telemetry   Severity of Illness: The appropriate patient status for this patient is INPATIENT. Inpatient status is judged to be reasonable and necessary in order to provide the required intensity of service to ensure the patient's safety. The patient's presenting symptoms, physical exam findings, and initial radiographic and laboratory data  in the context of their chronic comorbidities is felt to place them at high risk for further clinical deterioration. Furthermore, it is not anticipated that the patient will be medically stable for discharge from the hospital within 2 midnights of admission. The following factors support the patient status of inpatient.   " The patient's presenting symptoms include aphasia. " The worrisome physical exam findings include aphasia. " The initial radiographic and laboratory data are worrisome because of new embolic stroke on imaging. " The chronic co-morbidities include hypertension, CKD.   * I certify that at the point of admission it is my clinical judgment that the patient will require inpatient hospital care spanning beyond 2 midnights from the point of admission due to high intensity of service, high risk for further deterioration and high frequency of surveillance required.Synetta Fail MD [facility]  How to contact the [facility] Attending or Consulting provider 7A - 7P or covering provider during after hours 7P -7A, for this patient?   1. Check the  care team in [facility] and look for a) attending/consulting [facility] provider listed and b) the [facility] team listed 2. Log into [email or web address] and use [place]'s universal password to access. If you do not have the password, please contact the hospital operator. 3. Locate the [facility] provider you are looking for under [facility] and page to a number that you can be directly reached. 4. If you still have difficulty reaching the provider, please page the [facility] (Director on Call) for the Hospitalists listed on amion for assistance.  01/07/2020, 7:36 PM

## 2020-01-07 NOTE — ED Notes (Signed)
Pt admitted to 3W22; report called to [place], [place].

## 2020-01-07 NOTE — ED Provider Notes (Signed)
[facility] EMERGENCY DEPARTMENT Provider Note   CSN: [id number] Arrival date & time: 01/07/20  1714  An emergency department physician performed an initial assessment on this suspected stroke patient at 1714.  History Chief Complaint  Patient presents with  . Code Stroke    Aurelie Dicenzo is a 86 y.o. female.  The history is provided by the patient, the EMS personnel and medical records.   Graelyn Bihl is a 86 y.o. female who presents to the Emergency Department complaining of stroke sxs.  Level V caveat due to aphasia. History provided by EMS. She presents the emergency department as a code stroke for difficulty speaking. Last known well at 4 PM today. Symptoms were sudden onset.    History reviewed. No pertinent past medical history.  Patient Active Problem List   Diagnosis Date Noted  . Acute CVA (cerebrovascular accident) (HCC) 01/07/2020  . Stage 3b chronic kidney disease (HCC) 11/24/2019  . History of embolic stroke 08/27/2018  . Adjustment insomnia 07/21/2018  . Adjustment disorder with depressed mood 07/21/2018  . Benign essential hypertension 04/12/2018  . Pure hypercholesterolemia 04/12/2018  . Primary osteoarthritis of right hip 03/30/2018  . Primary osteoarthritis of left hip 03/18/2018    Past Surgical History:  Procedure Laterality Date  . KNEE ARTHROSCOPY       OB History   No obstetric history on file.     Family History  Problem Relation Age of Onset  . Heart disease Father     Social History   Tobacco Use  . Smoking status: Former Games developer  . Smokeless tobacco: Never Used  Substance Use Topics  . Alcohol use: Not Currently  . Drug use: Never    Home Medications Prior to Admission medications   Medication Sig Start Date End Date Taking? Authorizing Provider  aspirin 325 MG tablet Take 325 mg by mouth daily.   Yes [provider]  atorvastatin (LIPITOR) 40 MG tablet Take 40 mg by mouth daily.   Yes  [provider]  citalopram (CELEXA) 10 MG tablet Take 10 mg by mouth daily.   Yes [provider]  COLLAGEN PO Take 1 capsule by mouth in the morning and at bedtime.   Yes [provider]  gabapentin (NEURONTIN) 100 MG capsule Take 100 mg by mouth at bedtime.   Yes [provider]  losartan (COZAAR) 25 MG tablet Take 25 mg by mouth daily.   Yes [provider]  mirtazapine (REMERON) 15 MG tablet Take 15 mg by mouth at bedtime.   Yes [provider]  Multiple Vitamins-Minerals (MULTIVITAMIN WITH MINERALS) tablet Take 1 tablet by mouth daily.   Yes [provider]  omeprazole (PRILOSEC) 40 MG capsule Take 40 mg by mouth daily.   Yes [provider]  traZODone (DESYREL) 100 MG tablet Take 100 mg by mouth at bedtime.   Yes [provider]    Allergies    Patient has no known allergies.  Review of Systems   Review of Systems  All other systems reviewed and are negative.   Physical Exam Updated Vital Signs BP (!) 126/110   Pulse 81   Temp 99.4 F (37.4 C) (Oral)   Resp 15   Wt 74.3 kg   SpO2 95%   BMI 27.26 kg/m   Physical Exam Vitals and nursing note reviewed.  Constitutional:      Appearance: She is well-developed.  HENT:     Head: Normocephalic and atraumatic.  Cardiovascular:  Rate and Rhythm: Normal rate and regular rhythm.  Pulmonary:     Effort: Pulmonary effort is normal. No respiratory distress.  Abdominal:     Palpations: Abdomen is soft.     Tenderness: There is no abdominal tenderness. There is no guarding or rebound.  Musculoskeletal:        General: No tenderness.  Skin:    General: Skin is warm and dry.  Neurological:     Mental Status: She is alert.     Comments: Dense expressive aphasia.  Follows commands.  Mild left facial droop.  5/5 strength in all four extremities.    Psychiatric:        Behavior: Behavior normal.     ED Results / Procedures / Treatments    Labs (all labs ordered are listed, but only abnormal results are displayed) Labs Reviewed  DIFFERENTIAL - Abnormal; Notable for the following components:      Result Value   Monocytes Absolute 1.6 (*)    Abs Immature Granulocytes 0.14 (*)    All other components within normal limits  COMPREHENSIVE METABOLIC PANEL - Abnormal; Notable for the following components:   CO2 21 (*)    Glucose, Bld 107 (*)    BUN 24 (*)    Creatinine, Ser 1.44 (*)    Total Bilirubin 1.3 (*)    GFR, Estimated 36 (*)    All other components within normal limits  I-STAT CHEM 8, ED - Abnormal; Notable for the following components:   BUN 25 (*)    Creatinine, Ser 1.40 (*)    Glucose, Bld 106 (*)    Calcium, Ion 1.13 (*)    All other components within normal limits  CBG MONITORING, ED - Abnormal; Notable for the following components:   Glucose-Capillary 101 (*)    All other components within normal limits  RESPIRATORY PANEL BY RT PCR (FLU A&B, COVID)  CULTURE, BLOOD (ROUTINE X 2)  CULTURE, BLOOD (ROUTINE X 2)  PROTIME-INR  APTT  CBC  HEMOGLOBIN A1C  LIPID PANEL    EKG None  Radiology MR ANGIO HEAD WO CONTRAST  Result Date: 01/07/2020 CLINICAL DATA:  Stroke follow-up. EXAM: MRA HEAD WITHOUT CONTRAST TECHNIQUE: Angiographic images of the Circle of Willis were obtained using MRA technique without intravenous contrast. COMPARISON:  MRI from the same day and MRA from 09/25/2018 FINDINGS: Similar appearance of the anterior and posterior circulation comparison to prior MRA from 09/25/2018. No evidence of hemodynamically significant stenosis or large vessel occlusion involving the anterior posterior circulation. Mild irregularity of distal vessels likely relates to atherosclerosis. No evidence of aneurysm or vascular malformation. IMPRESSION: No evidence of proximal hemodynamically significant stenosis or large vessel occlusion.Similar appearance of the intracranial vessels in comparison to prior MRA from  09/25/2018. Electronically Signed   By: Feliberto Harts MD   On: 01/07/2020 20:30   MR BRAIN WO CONTRAST  Result Date: 01/07/2020 CLINICAL DATA:  Neuro deficit, acute stroke suspected. EXAM: MRI HEAD WITHOUT CONTRAST TECHNIQUE: Multiplanar, multiecho pulse sequences of the brain and surrounding structures were obtained without intravenous contrast. COMPARISON:  MRI August 26, 2018 FINDINGS: Brain: There is an acute infarct involving the high left frontal cortex. Additional punctate infarcts involving the posterior right parietal lobe (series 5, image 84), the posterior left temporal lobe (see series 5, images 68 and 69), and right frontal cortex (series 5, image 86). There is mild edema associated with the left frontal infarct without substantial mass effect. No midline shift. No evidence of acute hemorrhage.  Encephalomalacia associated with prior right frontoparietal infarct. Additional scattered T2/FLAIR hyperintensities are compatible with the sequela of prior microvascular ischemic disease. Small remote lacunar infarcts in bilateral cerebellar hemispheres. No mass lesion or abnormal mass effect. No hydrocephalus. Vascular: Proximal major arterial flow voids are maintained at the skull base. Skull and upper cervical spine: Normal marrow signal. Sinuses/Orbits: Scattered mucosal thickening without air-fluid levels in the sinuses. Negative orbits. Other: Small right greater than left mastoid effusions. IMPRESSION: 1. Acute high left frontal cortical infarct with additional punctate infarcts involving the right parietal lobe, right frontal lobe, and posterior left temporal lobe, as detailed above. Involvement of multiple vascular territories suggests a central embolic etiology. 2. Remote infarcts and chronic microvascular ischemic disease, as described above. Findings discussed with Dr. Wilford CornerArora via telephone at 6:21 p.m. Electronically Signed   By: Feliberto HartsFrederick S Jones MD   On: 01/07/2020 18:23   CT HEAD CODE  STROKE WO CONTRAST  Result Date: 01/07/2020 CLINICAL DATA:  Code stroke. Neuro deficit, acute stroke suspected. Slurred speech and dysphagia. EXAM: CT HEAD WITHOUT CONTRAST TECHNIQUE: Contiguous axial images were obtained from the base of the skull through the vertex without intravenous contrast. COMPARISON:  MRI and CT from a 08/26/2018 FINDINGS: Brain: Slight progression of hypoattenuation in the high right frontoparietal region, in the region of infarcts seen on prior MRI. There is suggestion of volume loss in this region additional patchy areas of white matter attenuation are similar to prior and likely represent the sequela of chronic microvascular ischemic disease or prior small infarcts. No acute hemorrhage. Vascular: No hyperdense vessel or unexpected calcification. Skull: No acute fracture. Sinuses/Orbits: Opacification a left posterior ethmoid air cell. Scattered mucosal thickening. Unremarkable orbits. Other: No mastoid effusions. IMPRESSION: 1. Slight progression of hypoattenuation in the high right frontoparietal region. This is favored to represent evolution of infarcts seen in this region on the prior MRI given suggestion of associated volume loss; however, superimposed new/acute infarct is difficult to exclude. An MRI could further evaluate if clinically indicated. 2. No acute hemorrhage. Code stroke imaging results were communicated on 01/07/2020 at 5:35 pm to provider Dr. Wilford CornerArora via telephone, who verbally acknowledged these results. Electronically Signed   By: Feliberto HartsFrederick S Jones MD   On: 01/07/2020 17:39    Procedures Procedures (including critical care time)  Medications Ordered in ED Medications  0.9 %  sodium chloride infusion ( Intravenous New Bag/Given 01/07/20 2058)  acetaminophen (TYLENOL) tablet 650 mg (has no administration in time range)    Or  acetaminophen (TYLENOL) 160 MG/5ML solution 650 mg (has no administration in time range)    Or  acetaminophen (TYLENOL) suppository  650 mg (has no administration in time range)  senna-docusate (Senokot-S) tablet 1 tablet (has no administration in time range)  enoxaparin (LOVENOX) injection 40 mg (40 mg Subcutaneous Given 01/07/20 2059)  aspirin EC tablet 81 mg (has no administration in time range)  atorvastatin (LIPITOR) tablet 40 mg (has no administration in time range)  pantoprazole (PROTONIX) EC tablet 40 mg (has no administration in time range)  sodium chloride flush (NS) 0.9 % injection 3 mL (3 mLs Intravenous Given 01/07/20 1946)   stroke: mapping our early stages of recovery book ( Does not apply Given 01/07/20 2059)    ED Course  I have reviewed the triage vital signs and the nursing notes.  Pertinent labs & imaging results that were available during my care of the patient were reviewed by me and considered in my medical decision making (see chart for  details).    MDM Rules/Calculators/A&P                          Patient presents the emergency department as a code stroke for expressive aphasia. Patient with dense aphasia on ED presentation that limits full exam. CT scan personally reviewed. Labs with mild elevation and creatinine. She was evaluated by neurology on ED presentation. Medicine consulted for admission for ongoing treatment. Final Clinical Impression(s) / ED Diagnoses Final diagnoses:  Acute ischemic stroke [facility])    Rx / DC Orders ED Discharge Orders    None       Tilden Fossa, MD 01/07/20 2130

## 2020-01-07 NOTE — ED Triage Notes (Signed)
Pt presents from home (lives with daughter), LSW 1600. Family called EMS for aphasia and L sensory loss.  No blood thinners. EMS v/s CBG 128, RR22, 140/80, 92bpm, 95%RA.

## 2020-01-07 NOTE — ED Notes (Signed)
Pt transported to MRI 

## 2020-01-08 ENCOUNTER — Inpatient Hospital Stay (HOSPITAL_COMMUNITY): Payer: Medicare HMO

## 2020-01-08 ENCOUNTER — Other Ambulatory Visit (HOSPITAL_COMMUNITY): Payer: Medicare HMO

## 2020-01-08 ENCOUNTER — Encounter (HOSPITAL_COMMUNITY): Payer: Medicare HMO

## 2020-01-08 DIAGNOSIS — Z8673 Personal history of transient ischemic attack (TIA), and cerebral infarction without residual deficits: Secondary | ICD-10-CM

## 2020-01-08 DIAGNOSIS — M1611 Unilateral primary osteoarthritis, right hip: Secondary | ICD-10-CM

## 2020-01-08 DIAGNOSIS — I6389 Other cerebral infarction: Secondary | ICD-10-CM

## 2020-01-08 LAB — LIPID PANEL
Cholesterol: 136 mg/dL (ref 0–200)
HDL: 44 mg/dL (ref >40)
LDL Cholesterol: 74 mg/dL (ref 0–99)
Total CHOL/HDL Ratio: 3.1 RATIO
Triglycerides: 89 mg/dL (ref <150)
VLDL: 18 mg/dL (ref 0–40)

## 2020-01-08 LAB — ECHOCARDIOGRAM COMPLETE
Area-P 1/2: 3.99 cm2
S' Lateral: 2.6 cm
Weight: 2630.4 oz

## 2020-01-08 LAB — PROCALCITONIN: Procalcitonin: <0.1 ng/mL

## 2020-01-08 MED ORDER — NIACIN 100 MG PO TABS
50.0000 mg | ORAL_TABLET | Freq: Two times a day (BID) | ORAL | Status: DC
  Administered 2020-01-08 – 2020-01-10 (×4): 50 mg via ORAL
  Filled 2020-01-08 (×5): qty 1

## 2020-01-08 NOTE — Evaluation (Signed)
Physical Therapy Evaluation Patient Details Name: Shannon Nixon MRN: [id number] DOB: May 22, 1933 Today's Date: 01/08/2020   History of Present Illness  86 y.o. female with medical history significant of prior strokes most recently in 2020 at [facility], hypertension, OA, hyperlipidemia, CKD, adjustment disorder with depressed mood, and adjustment insomnia presented following onset of aphasia at home. MRI brain showed acute high left frontal cortical infarct with additional punctate infarcts in various lobes suggestive of embolic etiology.   Clinical Impression  Pt presents to PT with deficits in functional mobility, gait, balance, endurance, power, strength, safety awareness, and communication. Pt with expressive>receptive aphasia which greatly impairs communication during session. Pt is impulsive and requires multiple cues to maintain safety and manage DME during session. Pt needs physical assistance to maintain balance during transfers and gait at this time. Pt will benefit from continued acute PT POC to improve mobility quality and reduce falls risk. PT recommends CIR placement at this time.    Follow Up Recommendations CIR    Equipment Recommendations  Rolling walker with 5" wheels;3in1 (PT) (need to confirm DME the pt owns with family)    Recommendations for Other Services Rehab consult     Precautions / Restrictions Precautions Precautions: Fall Restrictions Weight Bearing Restrictions: No      Mobility  Bed Mobility Overal bed mobility: Needs Assistance Bed Mobility: Sit to Supine       Sit to supine: Supervision        Transfers Overall transfer level: Needs assistance Equipment used: Rolling walker (2 wheeled) Transfers: Sit to/from [employer] Sit to Stand: Min assist Stand pivot transfers: Min assist          Ambulation/Gait Ambulation/Gait assistance: Min assist Gait Distance (Feet): 20 Feet Assistive device: Rolling walker (2  wheeled) Gait Pattern/deviations: Step-to pattern Gait velocity: reduced Gait velocity interpretation: <1.31 ft/sec, indicative of household ambulator General Gait Details: pt with slowed step to gait, increased trunk flexion, reduced step length  Stairs            Wheelchair Mobility    Modified Rankin (Stroke Patients Only) Modified Rankin (Stroke Patients Only) Pre-Morbid Rankin Score: Slight disability (need to confirm with family) Modified Rankin: Moderately severe disability     Balance Overall balance assessment: Needs assistance Sitting-balance support: Single extremity supported;Feet supported Sitting balance-Leahy Scale: Poor Sitting balance - Comments: reliant on UE support of bed   Standing balance support: Bilateral upper extremity supported Standing balance-Leahy Scale: Poor Standing balance comment: minA with BUE support of RW                             Pertinent Vitals/Pain Pain Assessment: No/denies pain    Home Living Family/patient expects to be discharged to:: Private residence Living Arrangements: Alone Available Help at Discharge: Family;Available PRN/intermittently Type of Home: House Home Access: Stairs to enter   [employer] of Steps: ??? Home Layout: One level Home Equipment:  (unsure)      Prior Function Level of Independence: Independent         Comments: pt reports independent, living alone. Will need to confirm with family due to communication deficits     Hand Dominance        Extremity/Trunk Assessment   Upper Extremity Assessment Upper Extremity Assessment: Overall WFL for tasks assessed    Lower Extremity Assessment Lower Extremity Assessment: Generalized weakness    Cervical / Trunk Assessment Cervical / Trunk Assessment: Kyphotic  Communication  Communication: Receptive difficulties;Expressive difficulties  Cognition Arousal/Alertness: Awake/alert Behavior During Therapy:  Impulsive Overall Cognitive Status: Difficult to assess                                 General Comments: pt does not appear to be oriented during this session, unable to properly identify name, place, situation with choice of 2. Pt does follow motor commands well, although she is impulsive and demonstrates poor safety awareness      General Comments General comments (skin integrity, edema, etc.): VSS on RA    Exercises     Assessment/Plan    PT Assessment Patient needs continued PT services  PT Problem List Decreased strength;Decreased activity tolerance;Decreased balance;Decreased mobility;Decreased cognition;Decreased knowledge of use of DME;Decreased safety awareness;Decreased knowledge of precautions       PT Treatment Interventions DME instruction;Gait training;Stair training;Functional mobility training;Therapeutic activities;Therapeutic exercise;Balance training;Neuromuscular re-education;Cognitive remediation;Patient/family education    PT Goals (Current goals can be found in the Care Plan section)  Acute Rehab PT Goals Patient Stated Goal: To improve communication and mobility PT Goal Formulation: With patient Time For Goal Achievement: 01/22/20 Potential to Achieve Goals: Good    Frequency Min 4X/week   Barriers to discharge        Co-evaluation               AM-PAC PT "6 Clicks" Mobility  Outcome Measure Help needed turning from your back to your side while in a flat bed without using bedrails?: None Help needed moving from lying on your back to sitting on the side of a flat bed without using bedrails?: None Help needed moving to and from a bed to a chair (including a wheelchair)?: A Little Help needed standing up from a chair using your arms (e.g., wheelchair or bedside chair)?: A Little Help needed to walk in hospital room?: A Little Help needed climbing 3-5 steps with a railing? : A Lot 6 Click Score: 19    End of Session   Activity  Tolerance: Patient tolerated treatment well Patient left: in bed;with call bell/phone within reach;with bed alarm set Nurse Communication: Mobility status PT Visit Diagnosis: Unsteadiness on feet (R26.81);Other abnormalities of gait and mobility (R26.89);Muscle weakness (generalized) (M62.81);Other symptoms and signs involving the nervous system (S50.539)    Time: 7673-4193 PT Time Calculation (min) (ACUTE ONLY): 12 min   Charges:   PT Evaluation $PT Eval Moderate Complexity: 1 Mod          Arlyss Gandy, PT, DPT Acute Rehabilitation Pager: [phone]   Arlyss Gandy 01/08/2020, 11:42 AM

## 2020-01-08 NOTE — Evaluation (Signed)
Occupational Therapy Evaluation Patient Details Name: Shannon Nixon MRN: [id number] DOB: 04/07/1933 Today's Date: 01/08/2020    History of Present Illness 86 y.o. female with medical history significant of prior strokes most recently in 2020 at [facility], hypertension, OA, hyperlipidemia, CKD, adjustment disorder with depressed mood, and adjustment insomnia presented following onset of aphasia at home. MRI brain showed acute high left frontal cortical infarct with additional punctate infarcts in various lobes suggestive of embolic etiology.    Clinical Impression   This 86 y/o female presents with the above. Per chart pt living alone PTA and performing ADL and mobility tasks independently. Pt now presenting with the above and below listed deficits including aphasia, impaired cognition, balance and suspected visuoperceptual deficits. Pt requiring minA for room level mobility and up to max verbal/tactile cues for safety throughout; requiring up to modA for LB and toileting ADL. Pt requiring cues during functional tasks for proper sequencing and intermittently to locate ADL items. She will benefit from continued acute OT services and currently feel she will benefit from CIR level therapies at time of discharge to maximize her overall safety and independence with ADL and mobility.     Follow Up Recommendations  CIR    Equipment Recommendations  Other (comment);Tub/shower seat (TBD)           Precautions / Restrictions Precautions Precautions: Fall Restrictions Weight Bearing Restrictions: No      Mobility Bed Mobility Overal bed mobility: Needs Assistance Bed Mobility: Sit to Supine;Supine to Sit     Supine to sit: Supervision Sit to supine: Supervision   General bed mobility comments: performing both as pt impulsive to return to supine prior to session end (and with gait belt still donned)     Transfers Overall transfer level: Needs assistance Equipment used: None;1  person hand held assist Transfers: Sit to/from Stand Sit to Stand: Min assist         General transfer comment: steadying assist throughout    Balance Overall balance assessment: Needs assistance Sitting-balance support: Single extremity supported;Feet supported Sitting balance-Leahy Scale: Poor Sitting balance - Comments: reliant on UE support of bed   Standing balance support: Single extremity supported;During functional activity Standing balance-Leahy Scale: Poor Standing balance comment: external assist for balance                            ADL either performed or assessed with clinical judgement   ADL Overall ADL's : Needs assistance/impaired Eating/Feeding: Set up;Sitting   Grooming: Minimal assistance;Standing;Wash/dry hands;Oral care Grooming Details (indicate cue type and reason): for balance; cues to perform all tasks of handwashing task, cues to locate oral care items on sink (?visuoperceptual deficit)  Upper Body Bathing: Minimal assistance;Sitting   Lower Body Bathing: Moderate assistance;Sit to/from stand   Upper Body Dressing : Minimal assistance;Sitting   Lower Body Dressing: Moderate assistance;Sit to/from stand   Toilet Transfer: Minimal assistance;Ambulation;Regular Toilet   Toileting- Architect and Hygiene: Sit to/from stand;Sitting/lateral lean;Moderate assistance Toileting - Clothing Manipulation Details (indicate cue type and reason): assist for thoroughness during pericare; cues to pull up mesh underwear prior to exiting bathroom as pt initially attempting to walk out prior to doing so; steadying assist throughout while in standing      Functional mobility during ADLs: Minimal assistance       Vision Baseline Vision/History: Wears glasses Wears Glasses:  (unsure, pt's glasses in room on bedside table ) Patient Visual Report: Other (comment) (pt unable  to state) Vision Assessment?: Vision impaired- to be further tested in  functional context Additional Comments: suspect visuoperceptual deficits, pt tending to gaze right - had much difficulty locating her toothbrush to the left side of sink during ADL task     Perception Perception Comments: will further assess    Praxis      Pertinent Vitals/Pain Pain Assessment: Faces Faces Pain Scale: No hurt Pain Intervention(s): Monitored during session     Hand Dominance Right   Extremity/Trunk Assessment Upper Extremity Assessment Upper Extremity Assessment: LUE deficits/detail LUE Deficits / Details: fine motor coordination grossly impaired in comparison to R LUE Coordination: decreased fine motor (grossly)   Lower Extremity Assessment Lower Extremity Assessment: Defer to PT evaluation   Cervical / Trunk Assessment Cervical / Trunk Assessment: Kyphotic   Communication Communication Communication: Receptive difficulties;Expressive difficulties   Cognition Arousal/Alertness: Awake/alert Behavior During Therapy: Impulsive Overall Cognitive Status: Difficult to assess                                 General Comments: pt with poor command follow and impulsive to move. pt up without assist attempting to get to bathroom upon arrival (all 4 bedrails up at the time, RN in to assist pt as well upon OT arrival). given multimodal cues pt follow motor commands and is able to accurately pick out ADL item when presented with two choices, completed x2 trials    General Comments       Exercises     Shoulder Instructions      Home Living Family/patient expects to be discharged to:: Private residence Living Arrangements: Alone Available Help at Discharge: Family;Available PRN/intermittently Type of Home: House Home Access: Stairs to enter     Home Layout: One level               Home Equipment:  (unsure)   Additional Comments: pt with aphasia and unable to specify      Prior Functioning/Environment Level of Independence: Independent         Comments: pt reports independent, living alone. Will need to confirm with family due to communication deficits        OT Problem List: Decreased strength;Decreased range of motion;Decreased activity tolerance;Impaired balance (sitting and/or standing);Impaired vision/perception;Decreased coordination;Decreased cognition;Decreased safety awareness;Impaired UE functional use;Cardiopulmonary status limiting activity      OT Treatment/Interventions: Self-care/ADL training;Therapeutic exercise;Energy conservation;Neuromuscular education;DME and/or AE instruction;Therapeutic activities;Cognitive remediation/compensation;Visual/perceptual remediation/compensation;Patient/family education;Balance training    OT Goals(Current goals can be found in the care plan section) Acute Rehab OT Goals Patient Stated Goal: pt unable to state, agreeable to working with thearpy OT Goal Formulation: With patient Time For Goal Achievement: 01/22/20 Potential to Achieve Goals: Good  OT Frequency: Min 2X/week   Barriers to D/C:            Co-evaluation              AM-PAC OT "6 Clicks" Daily Activity     Outcome Measure Help from another person eating meals?: A Little Help from another person taking care of personal grooming?: A Little Help from another person toileting, which includes using toliet, bedpan, or urinal?: A Lot Help from another person bathing (including washing, rinsing, drying)?: A Lot Help from another person to put on and taking off regular upper body clothing?: A Little Help from another person to put on and taking off regular lower body clothing?: A Lot 6 Click Score: 15  End of Session Equipment Utilized During Treatment: Gait belt Nurse Communication: Mobility status  Activity Tolerance: Patient tolerated treatment well Patient left: in bed;with call bell/phone within reach;with bed alarm set  OT Visit Diagnosis: Cognitive communication deficit  (R41.841);Unsteadiness on feet (R26.81);Other symptoms and signs involving cognitive function Symptoms and signs involving cognitive functions: Cerebral infarction                Time: 1093-2355 OT Time Calculation (min): 17 min Charges:  OT General Charges $OT Visit: 1 Visit OT Evaluation $OT Eval Moderate Complexity: 1 Mod  Shannon Nixon, OT Acute Rehabilitation Services Pager [phone] Office [phone]   Orlando Penner 01/08/2020, 3:47 PM

## 2020-01-08 NOTE — Progress Notes (Signed)
Inpatient Rehab Admissions Coordinator Note:   Per PT/OT/ST recommendations, pt was screened for CIR candidacy by Wolfgang Phoenix, MS, CCC-SLP.  At this time we are recommending an in patient rehab consult.  AC will contact attending to request consult order.  Please contact me with questions.    Wolfgang Phoenix, MS, CCC-SLP Admissions Coordinator [phone] 01/08/20 4:10 PM

## 2020-01-08 NOTE — Progress Notes (Addendum)
PROGRESS NOTE                                                                                                                                                                                                             Patient Demographics:    Shannon Nixon, is a 86 y.o. female, DOB - 07-23-1933, RCV:[id number]  Admit date - 01/07/2020   Admitting Physician Synetta Fail, MD  Outpatient Primary MD for the patient is [place], [place]  LOS - 1  Chief Complaint  Patient presents with  . Code Stroke       Brief Narrative (HPI from H&P) - Shannon Nixon is a 86 y.o. female with medical history significant of prior strokes most recently in 2020 at [facility], hypertension, OA, hyperlipidemia, CKD, adjustment disorder with depressed mood, and adjustment insomnia presented following onset of aphasia at home, upon arrival to the ER her NIH stroke scale was 3, she was seen by neurology and underwent MRI showing bihemispheric CVA suspicious for cardioembolic source, she was admitted for further treatment.   Subjective:    Shannon Nixon today in bed, appears to be comfortable, she has expressive aphasia and unable to answer questions reliably but able to follow simple commands.   Assessment  & Plan :     1.  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  Neurology on board.  Allow permissive hypertension, currently on aspirin 81 mg and statin 40 mg per cardiology.  Full stroke work-up underway.  Defer further management to cardiology team.  LDL is near goal at 78 and will add niacin to statin.  No documented history of A. Fib and recent work-up with Zio patch at [facility] did not reveal A. fib either.  No results found for: HGBA1C    2.  Dyslipidemia.  On statin, niacin added as LDL slightly above goal.  3.  GERD.  On PPI.  4.  CKD 3B.  Baseline creatinine  appears anywhere between 1.3-1.8.  Monitor.  Check from [facility] results.     Condition - Extremely Guarded  Family Communication  :  Daughter - [phone] 01/08/20  Code Status :  Full  Consults  :  Neuro  Procedures  :    MRI-A - 1. Acute high left frontal cortical infarct with additional punctate infarcts involving the right parietal lobe, right frontal lobe, and posterior left temporal lobe, as detailed above.  Involvement of multiple vascular territories suggests a central embolic etiology. 2. Remote infarcts and chronic microvascular ischemic disease, as described above.  TTE -   Carotid US -   PUD Prophylaxis : PPI  Disposition Plan  :    Status is: Inpatient  Remains inpatient appropriate because:IV treatments appropriate due to intensity of illness or inability to take PO   Dispo: The patient is from: Home              Anticipated d/c is to: Home              Anticipated d/c date is: 2 days              Patient currently is not medically stable to d/c.   DVT Prophylaxis  :  Lovenox   Lab Results  Component Value Date   PLT 181 01/07/2020    Diet :  Diet Order            DIET SOFT Room service appropriate? No; Fluid consistency: Thin  Diet effective 0500                  Inpatient Medications Scheduled Meds: . aspirin EC  81 mg Oral Daily  . atorvastatin  40 mg Oral Daily  . enoxaparin (LOVENOX) injection  40 mg Subcutaneous Q24H  . pantoprazole  40 mg Oral Daily   Continuous Infusions: PRN Meds:.acetaminophen **OR** acetaminophen (TYLENOL) oral liquid 160 mg/5 mL **OR** acetaminophen, senna-docusate  Antibiotics  :   Anti-infectives (From admission, onward)   None          Objective:   Vitals:   01/08/20 0250 01/08/20 0446 01/08/20 0636 01/08/20 0930  BP: 127/81 (!) 142/94 (!) 143/80 139/75  Pulse: 68 78 77 79  Resp: 18 19 18 19   Temp: 98.4 F (36.9 C) 98.6 F (37 C) 98.3 F (36.8 C) 98.2 F (36.8 C)  TempSrc: Oral Oral  Oral Oral  SpO2: 95% 93% 96% 93%  Weight:        SpO2: 93 %  Wt Readings from Last 3 Encounters:  01/07/20 74.6 kg  02/06/18 63.5 kg     Intake/Output Summary (Last 24 hours) at 01/08/2020 1035 Last data filed at 01/08/2020 0316 Gross per 24 hour  Intake 471.73 ml  Output --  Net 471.73 ml     Physical Exam  Awake Alert, expressive aphasia,  old contracture and weakness in the left arm [place].AT,PERRAL Supple Neck,No JVD, No cervical lymphadenopathy appriciated.  Symmetrical Chest wall movement, Good air movement bilaterally, CTAB RRR,No Gallops,Rubs or new Murmurs, No Parasternal Heave +ve B.Sounds, Abd Soft, No tenderness, No organomegaly appriciated, No rebound - guarding or rigidity. No Cyanosis, Clubbing or edema, No new Rash or bruise       Data Review:   Recent Labs  Lab 01/07/20 1722 01/07/20 1723  WBC  --  10.3  HGB 12.9 12.8  HCT 38.0 40.3  PLT  --  181  MCV  --  99.8  MCH  --  31.7  MCHC  --  31.8  RDW  --  13.7  LYMPHSABS  --  2.2  MONOABS  --  1.6*  EOSABS  --  0.2  BASOSABS  --  0.0    Recent Labs  Lab 01/07/20 1722 01/07/20 1723 01/08/20 0231  NA 139 138  --   K 4.4 4.3  --   CL 106 104  --   CO2  --  21*  --  GLUCOSE 106* 107*  --   BUN 25* 24*  --   CREATININE 1.40* 1.44*  --   CALCIUM  --  9.7  --   AST  --  33  --   ALT  --  17  --   ALKPHOS  --  80  --   BILITOT  --  1.3*  --   ALBUMIN  --  4.3  --   PROCALCITON  --   --  <0.10  INR  --  1.1  --     Recent Labs  Lab 01/07/20 1740 01/08/20 0231  PROCALCITON  --  <0.10  SARSCOV2NAA NEGATIVE  --     ------------------------------------------------------------------------------------------------------------------ Recent Labs    01/08/20 0231  CHOL 136  HDL 44  LDLCALC 74  TRIG 89  CHOLHDL 3.1    No results found for: HGBA1C ------------------------------------------------------------------------------------------------------------------ No results for  input(s): TSH, T4TOTAL, T3FREE, THYROIDAB in the last 72 hours.  Invalid input(s): FREET3 ------------------------------------------------------------------------------------------------------------------ No results for input(s): VITAMINB12, FOLATE, FERRITIN, TIBC, IRON, RETICCTPCT in the last 72 hours.  Coagulation profile Recent Labs  Lab 01/07/20 1723  INR 1.1    No results for input(s): DDIMER in the last 72 hours.  Cardiac Enzymes No results for input(s): CKMB, TROPONINI, MYOGLOBIN in the last 168 hours.  Invalid input(s): CK ------------------------------------------------------------------------------------------------------------------ No results found for: BNP  Micro Results Recent Results (from the past 240 hour(s))  Respiratory Panel by RT PCR (Flu A&B, Covid) - Nasopharyngeal Swab     Status: None   Collection Time: 01/07/20  5:40 PM   Specimen: Nasopharyngeal Swab  Result Value Ref Range Status   SARS Coronavirus 2 by RT PCR NEGATIVE NEGATIVE Final    Comment: (NOTE) SARS-CoV-2 target nucleic acids are NOT DETECTED.  The SARS-CoV-2 RNA is generally detectable in upper respiratoy specimens during the acute phase of infection. The lowest concentration of SARS-CoV-2 viral copies this assay can detect is 131 copies/mL. A negative result does not preclude SARS-Cov-2 infection and should not be used as the sole basis for treatment or other patient management decisions. A negative result may occur with  improper specimen collection/handling, submission of specimen other than nasopharyngeal swab, presence of viral mutation(s) within the areas targeted by this assay, and inadequate number of viral copies (<131 copies/mL). A negative result must be combined with clinical observations, patient history, and epidemiological information. The expected result is Negative.  Fact Sheet for Patients:  https://www.moore.com/  Fact Sheet for Healthcare  Providers:  https://www.young.biz/  This test is no t yet approved or cleared by the [place] FDA and  has been authorized for detection and/or diagnosis of SARS-CoV-2 by FDA under an Emergency Use Authorization (EUA). This EUA will remain  in effect (meaning this test can be used) for the duration of the COVID-19 declaration under Section 564(b)(1) of the Act, 21 U.S.C. section 360bbb-3(b)(1), unless the authorization is terminated or revoked sooner.     Influenza A by PCR NEGATIVE NEGATIVE Final   Influenza B by PCR NEGATIVE NEGATIVE Final    Comment: (NOTE) The Xpert Xpress SARS-CoV-2/FLU/RSV assay is intended as an aid in  the diagnosis of influenza from Nasopharyngeal swab specimens and  should not be used as a sole basis for treatment. Nasal washings and  aspirates are unacceptable for Xpert Xpress SARS-CoV-2/FLU/RSV  testing.  Fact Sheet for Patients: https://www.moore.com/  Fact Sheet for Healthcare Providers: https://www.young.biz/  This test is not yet approved or cleared by the [place] and  has been authorized for detection and/or diagnosis of SARS-CoV-2 by  FDA under an Emergency Use Authorization (EUA). This EUA will remain  in effect (meaning this test can be used) for the duration of the  Covid-19 declaration under Section 564(b)(1) of the Act, 21  U.S.C. section 360bbb-3(b)(1), unless the authorization is  terminated or revoked. Performed at [facility], [address], [place], [place] [address]     Radiology Reports MR ANGIO HEAD WO CONTRAST  Result Date: 01/07/2020 CLINICAL DATA:  Stroke follow-up. EXAM: MRA HEAD WITHOUT CONTRAST TECHNIQUE: Angiographic images of the Circle of Willis were obtained using MRA technique without intravenous contrast. COMPARISON:  MRI from the same day and MRA from 09/25/2018 FINDINGS: Similar appearance of the anterior and posterior circulation  comparison to prior MRA from 09/25/2018. No evidence of hemodynamically significant stenosis or large vessel occlusion involving the anterior posterior circulation. Mild irregularity of distal vessels likely relates to atherosclerosis. No evidence of aneurysm or vascular malformation. IMPRESSION: No evidence of proximal hemodynamically significant stenosis or large vessel occlusion.Similar appearance of the intracranial vessels in comparison to prior MRA from 09/25/2018. Electronically Signed   By: Feliberto Harts MD   On: 01/07/2020 20:30   MR BRAIN WO CONTRAST  Result Date: 01/07/2020 CLINICAL DATA:  Neuro deficit, acute stroke suspected. EXAM: MRI HEAD WITHOUT CONTRAST TECHNIQUE: Multiplanar, multiecho pulse sequences of the brain and surrounding structures were obtained without intravenous contrast. COMPARISON:  MRI August 26, 2018 FINDINGS: Brain: There is an acute infarct involving the high left frontal cortex. Additional punctate infarcts involving the posterior right parietal lobe (series 5, image 84), the posterior left temporal lobe (see series 5, images 68 and 69), and right frontal cortex (series 5, image 86). There is mild edema associated with the left frontal infarct without substantial mass effect. No midline shift. No evidence of acute hemorrhage. Encephalomalacia associated with prior right frontoparietal infarct. Additional scattered T2/FLAIR hyperintensities are compatible with the sequela of prior microvascular ischemic disease. Small remote lacunar infarcts in bilateral cerebellar hemispheres. No mass lesion or abnormal mass effect. No hydrocephalus. Vascular: Proximal major arterial flow voids are maintained at the skull base. Skull and upper cervical spine: Normal marrow signal. Sinuses/Orbits: Scattered mucosal thickening without air-fluid levels in the sinuses. Negative orbits. Other: Small right greater than left mastoid effusions. IMPRESSION: 1. Acute high left frontal cortical  infarct with additional punctate infarcts involving the right parietal lobe, right frontal lobe, and posterior left temporal lobe, as detailed above. Involvement of multiple vascular territories suggests a central embolic etiology. 2. Remote infarcts and chronic microvascular ischemic disease, as described above. Findings discussed with Dr. Wilford Corner via telephone at 6:21 p.m. Electronically Signed   By: Feliberto Harts MD   On: 01/07/2020 18:23   CT HEAD CODE STROKE WO CONTRAST  Result Date: 01/07/2020 CLINICAL DATA:  Code stroke. Neuro deficit, acute stroke suspected. Slurred speech and dysphagia. EXAM: CT HEAD WITHOUT CONTRAST TECHNIQUE: Contiguous axial images were obtained from the base of the skull through the vertex without intravenous contrast. COMPARISON:  MRI and CT from a 08/26/2018 FINDINGS: Brain: Slight progression of hypoattenuation in the high right frontoparietal region, in the region of infarcts seen on prior MRI. There is suggestion of volume loss in this region additional patchy areas of white matter attenuation are similar to prior and likely represent the sequela of chronic microvascular ischemic disease or prior small infarcts. No acute hemorrhage. Vascular: No hyperdense vessel or unexpected calcification. Skull: No acute fracture. Sinuses/Orbits:  Opacification a left posterior ethmoid air cell. Scattered mucosal thickening. Unremarkable orbits. Other: No mastoid effusions. IMPRESSION: 1. Slight progression of hypoattenuation in the high right frontoparietal region. This is favored to represent evolution of infarcts seen in this region on the prior MRI given suggestion of associated volume loss; however, superimposed new/acute infarct is difficult to exclude. An MRI could further evaluate if clinically indicated. 2. No acute hemorrhage. Code stroke imaging results were communicated on 01/07/2020 at 5:35 pm to provider Dr. Wilford Corner via telephone, who verbally acknowledged these results.  Electronically Signed   By: Feliberto Harts MD   On: 01/07/2020 17:39    Time Spent in minutes  30   Susa Raring M.D on 01/08/2020 at 10:35 AM  To page go to [email or web address] - password [email or web address]

## 2020-01-08 NOTE — Progress Notes (Signed)
  Echocardiogram 2D Echocardiogram has been performed.  Pieter Partridge 01/08/2020, 2:17 PM

## 2020-01-08 NOTE — Progress Notes (Addendum)
STROKE TEAM PROGRESS NOTE   HISTORY OF PRESENT ILLNESS (per record) Shannon Nixon is an 86 y.o. 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 Per family patient was normal at 1600. She went into the bathroom and when she came out her speech was garbled. They called EMS because these were same symptoms of a past stroke. Denies any blood thinners or atrial fibrillation. At baseline patient is unable to perform ADL's independently, she wears incontinence briefs, and is unable to be left alone without a caregiver. Has also been exhibiting memory loss. EMS evaluated the patient, she showed evidence of aphasia, code stroke was activated and she was brought in for emergent evaluation. On the bridge, NIH stroke scale 3-detailed below. CT head with question of acute/subacute hypodensities over chronic strokes but nothing for sure. Taken for stat MRI that reveals embolic strokes. ED course:  CTH: no hemorrhage BP162/72 BG: 120 MRI: final read pending:  Chart review of past stroke:  09/29/2018 was seen at [facility] MRI: 15-20 micro embolic infarcts affecting cerebellum and both cerebral hemispheres consistent with embolic disease. Date last known well: 01/07/20 Time last known well: 1600 tPA Given:no; too mild to treat,                                                          Modified Rankin: Rankin Score=3 NIHSS:3   INTERVAL HISTORY Son and daughter in law at bedside, reviewed images, answered questions. Expressive aphasia, can follow simple commands.    OBJECTIVE Vitals:   01/08/20 0140 01/08/20 0250 01/08/20 0446 01/08/20 0636  BP: (!) 146/91 127/81 (!) 142/94 (!) 143/80  Pulse: 82 68 78 77  Resp: 18 18 19 18   Temp: 99 F (37.2 C) 98.4 F (36.9 C) 98.6 F (37 C) 98.3 F (36.8 C)   TempSrc: Oral Oral Oral Oral  SpO2: 94% 95% 93% 96%  Weight:        CBC:  Recent Labs  Lab 01/07/20 1722 01/07/20 1723  WBC  --  10.3  NEUTROABS  --  6.2  HGB 12.9 12.8  HCT 38.0 40.3  MCV  --  99.8  PLT  --  181    Basic Metabolic Panel:  Recent Labs  Lab 01/07/20 1722 01/07/20 1723  NA 139 138  K 4.4 4.3  CL 106 104  CO2  --  21*  GLUCOSE 106* 107*  BUN 25* 24*  CREATININE 1.40* 1.44*  CALCIUM  --  9.7    Lipid Panel:     Component Value Date/Time   CHOL 136 01/08/2020 0231   TRIG 89 01/08/2020 0231   HDL 44 01/08/2020 0231   CHOLHDL 3.1 01/08/2020 0231   VLDL 18 01/08/2020 0231   LDLCALC 74 01/08/2020 0231   HgbA1c: No results found for: HGBA1C Urine Drug Screen: No results found for: LABOPIA, COCAINSCRNUR, LABBENZ, AMPHETMU, THCU, LABBARB  Alcohol Level No results found for: ETH  IMAGING  MR ANGIO HEAD WO CONTRAST 01/07/2020 IMPRESSION:  No evidence of proximal hemodynamically significant stenosis or large vessel occlusion.Similar appearance of the intracranial vessels in comparison to prior MRA from 09/25/2018.  MR BRAIN WO CONTRAST 01/07/2020 IMPRESSION:  1. Acute high left frontal cortical infarct with additional punctate infarcts involving the right parietal lobe, right frontal lobe, and posterior left temporal lobe, as detailed above. Involvement of multiple vascular territories suggests a central embolic etiology.  2. Remote infarcts and chronic microvascular ischemic disease, as described above.   CT HEAD CODE STROKE WO CONTRAST 01/07/2020 IMPRESSION:  1. Slight progression of hypoattenuation in the high right frontoparietal region. This is favored to represent evolution of infarcts seen in this region on the prior MRI given suggestion of associated volume loss; however, superimposed new/acute infarct is difficult to exclude. An MRI could further evaluate if clinically indicated.  2. No acute hemorrhage.   Transthoracic Echocardiogram   00/00/2021 Pending  Bilateral Carotid Dopplers  00/00/2021 Pending  ECG - SR rate 87 BPM. (See cardiology reading for complete details)  PHYSICAL EXAM Blood pressure (!) 143/80, pulse 77, temperature 98.3 F (36.8 C), temperature source Oral, resp. rate 18, weight 74.6 kg, SpO2 96 %.  Neurologic exam: Patient appears well-developed and well-nourished, elderly female, alert, expressive aphasia, able to follow simple commands easily however, blinks to threat bilaterally, extraocular movements intact bilaterally, pupils equally round and reactive to light, smile symmetric, states sensation intact normally, hearing intact to voice, midline tongue extension normal without fasciculations, patient is able to raise all 4 extremities antigravity without drift no focal deficit appreciated, normal tone and bulk throughout, no atrophy or abnormal movements, no ataxia or dysmetria.      ASSESSMENT/PLAN Ms. Shannon Nixon is a 86 y.o. 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  She did not receive IV t-PA due to mild deficits.  Stroke: Bi;ateral infarcts - embolic - source unknown.  Resultant expressive aphasia  Code Stroke CT Head - Slight progression of hypoattenuation in the high right frontoparietal region. This is favored to represent evolution of infarcts seen in this region on the prior MRI given suggestion of associated volume loss; however, superimposed new/acute infarct is difficult to exclude. No acute hemorrhage.    CT head - not ordered  MRI head - Acute high left frontal cortical infarct with additional punctate infarcts involving the right parietal lobe, right frontal lobe, and posterior left temporal lobe. Remote infarcts and chronic microvascular ischemic disease  MRA head  - No evidence of proximal hemodynamically significant stenosis or large vessel occlusion.Similar appearance of the intracranial vessels in comparison to prior MRA from 09/25/2018.   CTA H&N - not ordered  CT Perfusion - not ordered  Carotid Doppler - pending  2D Echo - pending  Patient may need TEE and loop pending echocardiogram and carotid Doppler results.  Sars Corona Virus 2 - negative  LDL - 74  HgbA1c - pending  UDS - not ordered  VTE prophylaxis - Lovenox Diet  Diet Order            Diet clear liquid Room service appropriate? Yes; Fluid consistency: Thin  Diet effective now                 aspirin 325 mg daily prior to admission, now on aspirin 81 mg daily  Patient counseled to be compliant with her antithrombotic medications  Ongoing aggressive stroke risk factor management  Therapy recommendations:  pending  Disposition:  Pending  Hypertension  Home BP meds: Cozaar  Current BP meds: none   Stable . Permissive hypertension (OK if < 220/120) but gradually normalize in 5-7 days  . Long-term BP goal normotensive  Hyperlipidemia  Home Lipid lowering medication:  Lipitor 40 mg daily   LDL 74, goal < 70  Current lipid lowering medication: Lipitor 40 mg daily   Continue statin at discharge  Other Stroke Risk Factors  Advanced age  Former cigarette smoker - quit  Previous ETOH use  Overweight, Body mass index is 27.36 kg/m., recommend weight loss, diet and exercise as appropriate   Hx stroke/TIA  Other Active Problems  Code status - Full code CKD - stage 3b - creatinine - 1.44   Hospital day # 1  Patient with bilateral embolic strokes in multiple vascular territories of unknown etiology.  Awaiting carotid Dopplers and echocardiogram.  Will order ultrasound of the lower extremities, may need TEE and possibly loop.  Stroke will continue to follow.  Personally examined patient and images, and have participated in and made any corrections  needed to history, physical, neuro exam,assessment and plan as stated above.  I have personally obtained the history, evaluated lab date, reviewed imaging studies and agree with radiology interpretations.    Naomie Dean, MD Stroke Neurology  I spent 35 minutes of face-to-face and non-face-to-face time with patient. This included prechart review, lab review, study review, order entry, electronic health record documentation, patient education on the different diagnostic and therapeutic options, counseling and coordination of care, risks and benefits of management, compliance, or risk factor reduction   To contact Stroke Continuity provider, please refer to [email or web address]. After hours, contact General Neurology

## 2020-01-08 NOTE — TOC Initial Note (Signed)
Transition of Care [facility]) - Initial/Assessment Note    Patient Details  Name: Shannon Nixon MRN: [id number] Date of Birth: 01/06/1934  Transition of [facility] CM/SW Contact:    Lawerance Sabal, RN Phone Number: 01/08/2020, 12:56 PM  Clinical Narrative:             Sherron Monday w patient's daughter, Eulah Citizen, at bedside. Patient lives with daughter and son in law. Daughter provides 24 hour care, she states she used to work in home health. At this time, she feels comfortable taking patient home, although we did not discuss CIR recommendations. She resources her daughters when additional help is needed. Eulah Citizen states they have all needed DME at home, WC, RW, 3/1, tub bench.  If they decide to go home w [facility] services, Eulah Citizen did not have preference to any specific HH company.      Newton Pigg Daughter   [phone]      Expected Discharge Plan: Home w Home Health Services Barriers to Discharge: Continued Medical Work up   Patient Goals and CMS Choice        Expected Discharge Plan and Services Expected Discharge Plan: Home w Home Health Services   Discharge Planning Services: CM Consult   Living arrangements for the past 2 months: Single Family Home                                      Prior Living Arrangements/Services Living arrangements for the past 2 months: Single Family Home Lives with:: Adult Children              Current home services: DME    Activities of Daily Living      Permission Sought/Granted                  Emotional Assessment              Admission diagnosis:  Acute ischemic stroke [facility]) [I63.9] Acute CVA (cerebrovascular accident) [facility]) [I63.9] Patient Active Problem List   Diagnosis Date Noted  . Acute CVA (cerebrovascular accident) (HCC) 01/07/2020  . Stage 3b chronic kidney disease (HCC) 11/24/2019  . History of embolic stroke 08/27/2018  . Adjustment insomnia 07/21/2018  . Adjustment disorder with depressed mood  07/21/2018  . Benign essential hypertension 04/12/2018  . Pure hypercholesterolemia 04/12/2018  . Primary osteoarthritis of right hip 03/30/2018  . Primary osteoarthritis of left hip 03/18/2018   PCP:  Burnis Medin, PA-C Pharmacy:   [id number] - [place], [place] - [address] [place] [address] Phone: [phone] Fax: [phone]     Social Determinants of Health (SDOH) Interventions    Readmission Risk Interventions No flowsheet data found.

## 2020-01-08 NOTE — Evaluation (Signed)
Speech Language Pathology Evaluation Patient Details Name: Adair Lemar MRN: [id number] DOB: 1933-07-24 Today's Date: 01/08/2020 Time: 1000-1030 SLP Time Calculation (min) (ACUTE ONLY): 30 min  Problem List:  Patient Active Problem List   Diagnosis Date Noted  . Acute CVA (cerebrovascular accident) (HCC) 01/07/2020  . Stage 3b chronic kidney disease (HCC) 11/24/2019  . History of embolic stroke 08/27/2018  . Adjustment insomnia 07/21/2018  . Adjustment disorder with depressed mood 07/21/2018  . Benign essential hypertension 04/12/2018  . Pure hypercholesterolemia 04/12/2018  . Primary osteoarthritis of right hip 03/30/2018  . Primary osteoarthritis of left hip 03/18/2018   Past Medical History: History reviewed. No pertinent past medical history. Past Surgical History:  Past Surgical History:  Procedure Laterality Date  . KNEE ARTHROSCOPY     HPI:  86 y.o. female with medical history significant of prior strokes most recently in 2020 at [facility], hypertension, OA, hyperlipidemia, CKD, adjustment disorder with depressed mood, and adjustment insomnia presented following onset of aphasia at home.  Patient unable to significantly participate in exam due to her aphasia history obtained via chart review and with the assistance of the patient's daughter.  Patient was last seen normal around 4 PM she evidently went to the bathroom and came out with garbled speech and aphasia.  01/07/20 MRI brain indicated There is an acute infarct involving the high left frontal cortex. Additional punctate infarcts involving the posterior right parietal lobe (series 5, image 84), the posterior left temporal lobe (see series 5, images 68 and 69), and right frontal cortex (series 5, image 86). There is mild edema associated with the left frontal infarct without substantial mass effect. No midline shift. No evidence of acute hemorrhage. Encephalomalacia associated with prior right frontoparietal infarct.  Additional scattered T2/FLAIR hyperintensities are compatible with the sequela of prior microvascular ischemic disease. 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  Pt with awareness noted via non-verbal communication (ie: rolling eyes, showing frustration with not being able to get words out correctly, repeating phrases and trying to correct phrases when not understood) observed within session.  Pt used phonemic paraphasias such as "tencil" for "pencil" when asked to name objects.  She was able to state name/DOB with min verbal cues for phrase completion (eg: "Your name is.Marland KitchenMarland Kitchen") when SLP prompted her.  Perseverations noted throughout conversation with frequent halting of speech and repetition used to attempt to correct productions within simple words-phrases.  Pt able to repeat words-sentences and complete automatic counting and days of week tasks with min cues.  She followed simple 2-step commands during OME and while consuming breakfast tray and other functional tasks.  Pt used true words infrequently during speaking tasks, but when given breakfast tray, pt able to name items on tray effectively with min phonemic/semantic cues such as "I want to eat.." and pt would state "pancake" when unable to obtain previous spontaneous production of words.  Cognition difficult to assess due to level of fluent aphasia Pt would benefit from ST to remediate aphasia and ongoing assessment of cognition as pt able.  Thank you for this consult.    SLP Assessment  SLP Recommendation/Assessment: Patient needs continued Speech Language Pathology Services SLP Visit Diagnosis: Aphasia (R47.01)    Follow Up Recommendations  Outpatient SLP;Inpatient Rehab;Other (comment) (per family request/other therapists input  (OT/PT))    Frequency and Duration min  2x/week  2 weeks      SLP Evaluation Cognition  Overall Cognitive Status: Difficult to assess Arousal/Alertness: Awake/alert Orientation Level: Oriented to person;Oriented to situation (Due to aphasia, pt could not answer questions fluently) Awareness: Appears intact (difficult to assess d/t aphasia; non-verbal communication ) Behaviors: Verbal agitation;Perseveration;Confabulation Comments: Pt able to indicate call button when asked how to call nurse/help       Comprehension  Auditory Comprehension Overall Auditory Comprehension: Other (comment) (DTA fully) Yes/No Questions: Within Functional Limits Commands: Impaired Two Step Basic Commands: 50-74% accurate Conversation: Simple Other Conversation Comments:  (Fluent aphasia/word salad affect overall communication) Interfering Components: Other (comment) (aphasia) EffectiveTechniques: Repetition;Visual/Gestural cues (sentence completion/phonemic cues) Visual Recognition/Discrimination Discrimination: Not tested Reading Comprehension Reading Status: Not tested    Expression Expression Primary Mode of Expression: Verbal Verbal Expression Overall Verbal Expression: Impaired Initiation: Impaired Automatic Speech: Counting;Day of week Level of Generative/Spontaneous Verbalization: Word Repetition: Impaired Level of Impairment: Sentence level Naming: Impairment Responsive: 51-75% accurate Confrontation: Impaired Convergent: 25-49% accurate Divergent: 25-49% accurate Other Naming Comments:  (semantic/phonemic paraphasias; word salad) Verbal Errors: Semantic paraphasias;Phonemic paraphasias;Neologisms;Confabulation;Perseveration;Jargon Pragmatics: Unable to assess Effective Techniques: Sentence completion;Phonemic cues;Articulatory cues Non-Verbal Means of Communication: Not applicable Written Expression Dominant Hand: Right Written Expression: Not tested   Oral / Motor  Oral  Motor/Sensory Function Overall Oral Motor/Sensory Function: Within functional limits Motor Speech Overall Motor Speech: Other (comment) (DTA) Respiration: Within functional limits Phonation: Normal Resonance: Within functional limits Articulation:  (DTA) Word:  (decreased d/t aphasia/ jargon)                       Tressie Stalker, M.S., CCC-SLP 01/08/2020, 12:08 PM

## 2020-01-09 ENCOUNTER — Encounter (HOSPITAL_COMMUNITY)
Admission: EM | Disposition: A | Discharge status: Home Health | Payer: Self-pay | Source: Home / Self Care | Attending: Internal Medicine

## 2020-01-09 ENCOUNTER — Inpatient Hospital Stay (HOSPITAL_COMMUNITY): Payer: Medicare HMO | Admitting: Certified Registered"

## 2020-01-09 ENCOUNTER — Encounter (HOSPITAL_COMMUNITY): Payer: Self-pay | Admitting: Internal Medicine

## 2020-01-09 ENCOUNTER — Inpatient Hospital Stay (HOSPITAL_COMMUNITY): Payer: Medicare HMO

## 2020-01-09 DIAGNOSIS — I340 Nonrheumatic mitral (valve) insufficiency: Secondary | ICD-10-CM

## 2020-01-09 DIAGNOSIS — I639 Cerebral infarction, unspecified: Secondary | ICD-10-CM

## 2020-01-09 HISTORY — PX: TEE WITHOUT CARDIOVERSION: SHX5443

## 2020-01-09 HISTORY — PX: BUBBLE STUDY: SHX6837

## 2020-01-09 HISTORY — PX: LOOP RECORDER INSERTION: EP1214

## 2020-01-09 LAB — CBC WITH DIFFERENTIAL/PLATELET
Abs Immature Granulocytes: 0.07 10*3/uL (ref 0.00–0.07)
Basophils Absolute: 0 10*3/uL (ref 0.0–0.1)
Basophils Relative: 0 %
Eosinophils Absolute: 0.2 10*3/uL (ref 0.0–0.5)
Eosinophils Relative: 3 %
HCT: 35.9 % — ABNORMAL LOW (ref 36.0–46.0)
Hemoglobin: 12 g/dL (ref 12.0–15.0)
Immature Granulocytes: 1 %
Lymphocytes Relative: 30 %
Lymphs Abs: 2.1 10*3/uL (ref 0.7–4.0)
MCH: 32.3 pg (ref 26.0–34.0)
MCHC: 33.4 g/dL (ref 30.0–36.0)
MCV: 96.8 fL (ref 80.0–100.0)
Monocytes Absolute: 1.1 10*3/uL — ABNORMAL HIGH (ref 0.1–1.0)
Monocytes Relative: 15 %
Neutro Abs: 3.6 10*3/uL (ref 1.7–7.7)
Neutrophils Relative %: 51 %
Platelets: 195 10*3/uL (ref 150–400)
RBC: 3.71 MIL/uL — ABNORMAL LOW (ref 3.87–5.11)
RDW: 13.8 % (ref 11.5–15.5)
WBC: 7 10*3/uL (ref 4.0–10.5)
nRBC: 0 % (ref 0.0–0.2)

## 2020-01-09 LAB — COMPREHENSIVE METABOLIC PANEL
ALT: 14 U/L (ref 0–44)
AST: 34 U/L (ref 15–41)
Albumin: 3.7 g/dL (ref 3.5–5.0)
Alkaline Phosphatase: 78 U/L (ref 38–126)
Anion gap: 11 (ref 5–15)
BUN: 23 mg/dL (ref 8–23)
CO2: 22 mmol/L (ref 22–32)
Calcium: 9.4 mg/dL (ref 8.9–10.3)
Chloride: 105 mmol/L (ref 98–111)
Creatinine, Ser: 1.31 mg/dL — ABNORMAL HIGH (ref 0.44–1.00)
GFR, Estimated: 40 mL/min — ABNORMAL LOW (ref >60)
Glucose, Bld: 98 mg/dL (ref 70–99)
Potassium: 4.4 mmol/L (ref 3.5–5.1)
Sodium: 138 mmol/L (ref 135–145)
Total Bilirubin: 1.4 mg/dL — ABNORMAL HIGH (ref 0.3–1.2)
Total Protein: 6.5 g/dL (ref 6.5–8.1)

## 2020-01-09 LAB — HEMOGLOBIN A1C
Hgb A1c MFr Bld: 5.3 % (ref 4.8–5.6)
Mean Plasma Glucose: 105 mg/dL

## 2020-01-09 LAB — MAGNESIUM: Magnesium: 1.8 mg/dL (ref 1.7–2.4)

## 2020-01-09 LAB — PROCALCITONIN: Procalcitonin: <0.1 ng/mL

## 2020-01-09 LAB — BRAIN NATRIURETIC PEPTIDE: B Natriuretic Peptide: 98 pg/mL (ref 0.0–100.0)

## 2020-01-09 SURGERY — ECHOCARDIOGRAM, TRANSESOPHAGEAL
Anesthesia: Monitor Anesthesia Care

## 2020-01-09 SURGERY — LOOP RECORDER INSERTION

## 2020-01-09 MED ORDER — LIDOCAINE-EPINEPHRINE 1 %-1:100000 IJ SOLN
INTRAMUSCULAR | Status: DC | PRN
  Administered 2020-01-09: 15 mL

## 2020-01-09 MED ORDER — PROPOFOL 500 MG/50ML IV EMUL
INTRAVENOUS | Status: DC | PRN
  Administered 2020-01-09: 150 ug/kg/min via INTRAVENOUS

## 2020-01-09 MED ORDER — PROPOFOL 10 MG/ML IV BOLUS
INTRAVENOUS | Status: DC | PRN
  Administered 2020-01-09 (×2): 20 mg via INTRAVENOUS

## 2020-01-09 MED ORDER — ONDANSETRON HCL 4 MG/2ML IJ SOLN: 4.0000 mg | Freq: Four times a day (QID) | INTRAMUSCULAR | Status: DC | PRN

## 2020-01-09 MED ORDER — BUTAMBEN-TETRACAINE-BENZOCAINE 2-2-14 % EX AERO
INHALATION_SPRAY | CUTANEOUS | Status: DC | PRN
  Administered 2020-01-09: 2 via TOPICAL

## 2020-01-09 MED ORDER — SODIUM CHLORIDE 0.9 % IV SOLN: INTRAVENOUS | Status: DC

## 2020-01-09 MED ORDER — ACETAMINOPHEN 325 MG PO TABS: 325.0000 mg | ORAL_TABLET | ORAL | Status: DC | PRN

## 2020-01-09 MED ORDER — LACTATED RINGERS IV SOLN: INTRAVENOUS | Status: DC

## 2020-01-09 MED ORDER — LIDOCAINE-EPINEPHRINE 1 %-1:100000 IJ SOLN
INTRAMUSCULAR | Status: AC
  Filled 2020-01-09: qty 1

## 2020-01-09 MED ORDER — CLOPIDOGREL BISULFATE 75 MG PO TABS
75.0000 mg | ORAL_TABLET | Freq: Every day | ORAL | Status: DC
  Administered 2020-01-09 – 2020-01-10 (×2): 75 mg via ORAL
  Filled 2020-01-09 (×2): qty 1

## 2020-01-09 SURGICAL SUPPLY — 2 items
MONITOR REVEAL LINQ II (Prosthesis & Implant Heart) ×1 IMPLANT
PACK LOOP INSERTION (CUSTOM PROCEDURE TRAY) ×2 IMPLANT

## 2020-01-09 NOTE — Progress Notes (Signed)
    [facility] has been requested to perform a transesophageal echocardiogram on Shannon Nixon for CVA.  After careful review of history and examination, the risks and benefits of transesophageal echocardiogram have been explained including risks of esophageal damage, perforation (1:10,000 risk), bleeding, pharyngeal hematoma as well as other potential complications associated with conscious sedation including aspiration, arrhythmia, respiratory failure and death. Alternatives to treatment were discussed, questions were answered. Patient is willing to proceed.   Nada Boozer, NP  01/09/2020 12:57 PM

## 2020-01-09 NOTE — Anesthesia Procedure Notes (Signed)
Procedure Name: MAC Date/Time: 01/09/2020 1:49 PM Performed by: Amadeo Garnet, CRNA Pre-anesthesia Checklist: Patient identified, Suction available, Patient being monitored and Emergency Drugs available Patient Re-evaluated:Patient Re-evaluated prior to induction Oxygen Delivery Method: Nasal cannula Preoxygenation: Pre-oxygenation with 100% oxygen Induction Type: IV induction Placement Confirmation: positive ETCO2 Dental Injury: Teeth and Oropharynx as per pre-operative assessment

## 2020-01-09 NOTE — Progress Notes (Signed)
PROGRESS NOTE                                                                                                                                                                                                             Patient Demographics:    Shannon Nixon, is a 86 y.o. female, DOB - [birth year], ZOX:[id number]  Admit date - 01/07/2020   Admitting Physician Synetta Fail, MD  Outpatient Primary MD for the patient is [place], [place]  LOS - 2  Chief Complaint  Patient presents with  . Code Stroke       Brief Narrative (HPI from H&P) - Shannon Nixon is a 86 y.o. female with medical history significant of prior strokes most recently in 2020 at [facility], hypertension, OA, hyperlipidemia, CKD, adjustment disorder with depressed mood, and adjustment insomnia presented following onset of aphasia at home, upon arrival to the ER her NIH stroke scale was 3, she was seen by neurology and underwent MRI showing bihemispheric CVA suspicious for cardioembolic source, she was admitted for further treatment.   Subjective:    Shannon Nixon today in bed, appears to be comfortable, she has expressive aphasia and unable to answer questions reliably but able to follow simple commands.   Assessment  & Plan :     1.  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  Neurology on board.  Allow permissive hypertension, currently on aspirin 81 mg and statin 40 mg per cardiology.  Full stroke work-up underway.  Defer further management to cardiology team.  LDL is near goal at 78 and will added niacin to statin.  No documented history of A. Fib and recent work-up with Zio patch at [facility] did not reveal A. fib either.  Per neurology she will undergo leg venous ultrasound, carotid ultrasound, TEE and loop recorder placement this admission.  EP following.  No results found for:  HGBA1C  Lab Results  Component Value Date   CHOL 136 01/08/2020   HDL 44 01/08/2020   LDLCALC 74 01/08/2020   TRIG 89 01/08/2020   CHOLHDL 3.1 01/08/2020      2.  Dyslipidemia.  On statin, niacin added as LDL slightly above goal.  3.  GERD.  On PPI.  4.  CKD 3B.  Baseline creatinine appears anywhere between 1.3-1.8.  Monitor.  Check from [facility] results.     Condition - Extremely Guarded  Family Communication  :  Daughter - [phone] 01/08/20  Code Status :  Full  Consults  :  Neuro, EP  Procedures  :    MRI-A - 1. Acute high left frontal cortical infarct with additional punctate infarcts involving the right parietal lobe, right frontal lobe, and posterior left temporal lobe, as detailed above. Involvement of multiple vascular territories suggests a central embolic etiology. 2. Remote infarcts and chronic microvascular ischemic disease, as described above.  TTE -   TEE  Loop recorder -   Carotid US  -   Leg [place] -     PUD Prophylaxis : PPI  Disposition Plan  :    Status is: Inpatient  Remains inpatient appropriate because:IV treatments appropriate due to intensity of illness or inability to take PO   Dispo: The patient is from: Home              Anticipated d/c is to: Home              Anticipated d/c date is: 2 days              Patient currently is not medically stable to d/c.   DVT Prophylaxis  :  Lovenox   Lab Results  Component Value Date   PLT 195 01/09/2020    Diet :  Diet Order            Diet NPO time specified Except for: Sips with Meds  Diet effective midnight                  Inpatient Medications Scheduled Meds: . aspirin EC  81 mg Oral Daily  . atorvastatin  40 mg Oral Daily  . enoxaparin (LOVENOX) injection  40 mg Subcutaneous Q24H  . niacin  50 mg Oral BID WC  . pantoprazole  40 mg Oral Daily   Continuous Infusions: PRN Meds:.acetaminophen **OR** acetaminophen (TYLENOL) oral liquid 160 mg/5 mL **OR**  acetaminophen, senna-docusate  Antibiotics  :   Anti-infectives (From admission, onward)   None          Objective:   Vitals:   01/08/20 2022 01/08/20 2350 01/09/20 0402 01/09/20 0829  BP: 109/60 (!) 115/57 137/85 (!) 158/81  Pulse: 74 69 79 79  Resp: 20 18 18 18   Temp: 98.8 F (37.1 C) 97.9 F (36.6 C) 97.8 F (36.6 C) (!) 97.5 F (36.4 C)  TempSrc: Oral Oral Oral Oral  SpO2: 95% 93% 94% 96%  Weight:        SpO2: 96 %  Wt Readings from Last 3 Encounters:  01/07/20 74.6 kg  02/06/18 63.5 kg    No intake or output data in the 24 hours ending 01/09/20 1103   Physical Exam  Awake Alert, expressive aphasia,  old contracture and weakness in the left arm Shannon Nixon.AT,PERRAL Supple Neck,No JVD, No cervical lymphadenopathy appriciated.  Symmetrical Chest wall movement, Good air movement bilaterally, CTAB RRR,No Gallops, Rubs or new Murmurs, No Parasternal Heave +ve B.Sounds, Abd Soft, No tenderness, No organomegaly appriciated, No rebound - guarding or rigidity. No Cyanosis, Clubbing or edema, No new Rash or bruise      Data Review:   Recent Labs  Lab 01/07/20 1722 01/07/20 1723 01/09/20 0418  WBC  --  10.3 7.0  HGB 12.9 12.8 12.0  HCT 38.0 40.3 35.9*  PLT  --  181 195  MCV  --  99.8 96.8  MCH  --  31.7 32.3  MCHC  --  31.8 33.4  RDW  --  13.7 13.8  LYMPHSABS  --  2.2 2.1  MONOABS  --  1.6* 1.1*  EOSABS  --  0.2 0.2  BASOSABS  --  0.0 0.0    Recent Labs  Lab 01/07/20 1722 01/07/20 1723 01/08/20 0231 01/09/20 0418  NA 139 138  --  138  K 4.4 4.3  --  4.4  CL 106 104  --  105  CO2  --  21*  --  22  GLUCOSE 106* 107*  --  98  BUN 25* 24*  --  23  CREATININE 1.40* 1.44*  --  1.31*  CALCIUM  --  9.7  --  9.4  AST  --  33  --  34  ALT  --  17  --  14  ALKPHOS  --  80  --  78  BILITOT  --  1.3*  --  1.4*  ALBUMIN  --  4.3  --  3.7  MG  --   --   --  1.8  PROCALCITON  --   --  <0.10 <0.10  INR  --  1.1  --   --   BNP  --   --   --  98.0     Recent Labs  Lab 01/07/20 1740 01/08/20 0231 01/09/20 0418  BNP  --   --  98.0  PROCALCITON  --  <0.10 <0.10  SARSCOV2NAA NEGATIVE  --   --     ------------------------------------------------------------------------------------------------------------------ Recent Labs    01/08/20 0231  CHOL 136  HDL 44  LDLCALC 74  TRIG 89  CHOLHDL 3.1    No results found for: HGBA1C ------------------------------------------------------------------------------------------------------------------ No results for input(s): TSH, T4TOTAL, T3FREE, THYROIDAB in the last 72 hours.  Invalid input(s): FREET3 ------------------------------------------------------------------------------------------------------------------ No results for input(s): VITAMINB12, FOLATE, FERRITIN, TIBC, IRON, RETICCTPCT in the last 72 hours.  Coagulation profile Recent Labs  Lab 01/07/20 1723  INR 1.1    No results for input(s): DDIMER in the last 72 hours.  Cardiac Enzymes No results for input(s): CKMB, TROPONINI, MYOGLOBIN in the last 168 hours.  Invalid input(s): CK ------------------------------------------------------------------------------------------------------------------    Component Value Date/Time   BNP 98.0 01/09/2020 0418    Micro Results Recent Results (from the past 240 hour(s))  Respiratory Panel by RT PCR (Flu A&B, Covid) - Nasopharyngeal Swab     Status: None   Collection Time: 01/07/20  5:40 PM   Specimen: Nasopharyngeal Swab  Result Value Ref Range Status   SARS Coronavirus 2 by RT PCR NEGATIVE NEGATIVE Final    Comment: (NOTE) SARS-CoV-2 target nucleic acids are NOT DETECTED.  The SARS-CoV-2 RNA is generally detectable in upper respiratoy specimens during the acute phase of infection. The lowest concentration of SARS-CoV-2 viral copies this assay can detect is 131 copies/mL. A negative result does not preclude SARS-Cov-2 infection and should not be used as the sole basis  for treatment or other patient management decisions. A negative result may occur with  improper specimen collection/handling, submission of specimen other than nasopharyngeal swab, presence of viral mutation(s) within the areas targeted by this assay, and inadequate number of viral copies (<131 copies/mL). A negative result must be combined with clinical observations, patient history, and epidemiological information. The expected result is Negative.  Fact Sheet for Patients:  https://www.moore.com/  Fact Sheet for Healthcare Providers:  https://www.young.biz/  This test is no t yet approved or cleared by the [place] FDA and  has been authorized for detection and/or diagnosis of SARS-CoV-2 by FDA under  an Emergency Use Authorization (EUA). This EUA will remain  in effect (meaning this test can be used) for the duration of the COVID-19 declaration under Section 564(b)(1) of the Act, 21 U.S.C. section 360bbb-3(b)(1), unless the authorization is terminated or revoked sooner.     Influenza A by PCR NEGATIVE NEGATIVE Final   Influenza B by PCR NEGATIVE NEGATIVE Final    Comment: (NOTE) The Xpert Xpress SARS-CoV-2/FLU/RSV assay is intended as an aid in  the diagnosis of influenza from Nasopharyngeal swab specimens and  should not be used as a sole basis for treatment. Nasal washings and  aspirates are unacceptable for Xpert Xpress SARS-CoV-2/FLU/RSV  testing.  Fact Sheet for Patients: https://www.moore.com/https://www.fda.gov/media/142436/download  Fact Sheet for Healthcare Providers: https://www.young.biz/https://www.fda.gov/media/142435/download  This test is not yet approved or cleared by the [place] FDA and  has been authorized for detection and/or diagnosis of SARS-CoV-2 by  FDA under an Emergency Use Authorization (EUA). This EUA will remain  in effect (meaning this test can be used) for the duration of the  Covid-19 declaration under Section 564(b)(1) of the Act, 21   U.S.C. section 360bbb-3(b)(1), unless the authorization is  terminated or revoked. Performed at [facility], [address] [place], [place] [address]   Culture, blood (routine x 2)     Status: None (Preliminary result)   Collection Time: 01/08/20  2:35 AM   Specimen: BLOOD  Result Value Ref Range Status   Specimen Description BLOOD RIGHT ANTECUBITAL  Final   Special Requests   Final    BOTTLES DRAWN AEROBIC AND ANAEROBIC Blood Culture adequate volume   Culture   Final    NO GROWTH < 12 HOURS Performed at [facility], [address] [place], [place] [address]    Report Status PENDING  Incomplete  Culture, blood (routine x 2)     Status: None (Preliminary result)   Collection Time: 01/08/20  2:41 AM   Specimen: BLOOD  Result Value Ref Range Status   Specimen Description BLOOD LEFT ANTECUBITAL  Final   Special Requests   Final    BOTTLES DRAWN AEROBIC AND ANAEROBIC Blood Culture adequate volume   Culture   Final    NO GROWTH < 12 HOURS Performed at [facility], [address] [place], [place] [address]    Report Status PENDING  Incomplete    Radiology Reports MR ANGIO HEAD WO CONTRAST  Result Date: 01/07/2020 CLINICAL DATA:  Stroke follow-up. EXAM: MRA HEAD WITHOUT CONTRAST TECHNIQUE: Angiographic images of the Circle of Willis were obtained using MRA technique without intravenous contrast. COMPARISON:  MRI from the same day and MRA from 09/25/2018 FINDINGS: Similar appearance of the anterior and posterior circulation comparison to prior MRA from 09/25/2018. No evidence of hemodynamically significant stenosis or large vessel occlusion involving the anterior posterior circulation. Mild irregularity of distal vessels likely relates to atherosclerosis. No evidence of aneurysm or vascular malformation. IMPRESSION: No evidence of proximal hemodynamically significant stenosis or large vessel occlusion.Similar appearance of the intracranial vessels in comparison to  prior MRA from 09/25/2018. Electronically Signed   By: Feliberto HartsFrederick S Jones MD   On: 01/07/2020 20:30   MR BRAIN WO CONTRAST  Result Date: 01/07/2020 CLINICAL DATA:  Neuro deficit, acute stroke suspected. EXAM: MRI HEAD WITHOUT CONTRAST TECHNIQUE: Multiplanar, multiecho pulse sequences of the brain and surrounding structures were obtained without intravenous contrast. COMPARISON:  MRI August 26, 2018 FINDINGS: Brain: There is an acute infarct involving the high left frontal cortex. Additional  punctate infarcts involving the posterior right parietal lobe (series 5, image 84), the posterior left temporal lobe (see series 5, images 68 and 69), and right frontal cortex (series 5, image 86). There is mild edema associated with the left frontal infarct without substantial mass effect. No midline shift. No evidence of acute hemorrhage. Encephalomalacia associated with prior right frontoparietal infarct. Additional scattered T2/FLAIR hyperintensities are compatible with the sequela of prior microvascular ischemic disease. Small remote lacunar infarcts in bilateral cerebellar hemispheres. No mass lesion or abnormal mass effect. No hydrocephalus. Vascular: Proximal major arterial flow voids are maintained at the skull base. Skull and upper cervical spine: Normal marrow signal. Sinuses/Orbits: Scattered mucosal thickening without air-fluid levels in the sinuses. Negative orbits. Other: Small right greater than left mastoid effusions. IMPRESSION: 1. Acute high left frontal cortical infarct with additional punctate infarcts involving the right parietal lobe, right frontal lobe, and posterior left temporal lobe, as detailed above. Involvement of multiple vascular territories suggests a central embolic etiology. 2. Remote infarcts and chronic microvascular ischemic disease, as described above. Findings discussed with Dr. Wilford Corner via telephone at 6:21 p.m. Electronically Signed   By: Feliberto Harts MD   On: 01/07/2020 18:23    ECHOCARDIOGRAM COMPLETE  Result Date: 01/08/2020    ECHOCARDIOGRAM REPORT   Patient Name:   Shannon Nixon Date of Exam: 01/08/2020 Medical Rec #:  [id number]         Height:       65.0 in Accession #:    [id number]        Weight:       164.4 lb Date of Birth:  1934/03/06         BSA:          1.820 m Patient Age:    86 years          BP:           121/74 mmHg Patient Gender: F                 HR:           86 bpm. Exam Location:  Inpatient Procedure: 2D Echo, Cardiac Doppler and Color Doppler Indications:    CVA  History:        Patient has no prior history of Echocardiogram examinations.                 Stroke, Signs/Symptoms:Altered Mental Status; Risk                 Factors:Hypertension and Dyslipidemia.  Sonographer:    Lavenia Atlas Referring Phys: [id number] Cecille Po MELVIN IMPRESSIONS  1. Left ventricular ejection fraction, by estimation, is 60 to 65%. The left ventricle has normal function. The left ventricle has no regional wall motion abnormalities. There is mild left ventricular hypertrophy. Left ventricular diastolic parameters are consistent with Grade I diastolic dysfunction (impaired relaxation).  2. Right ventricular systolic function is normal. The right ventricular size is normal. There is normal pulmonary artery systolic pressure. The estimated right ventricular systolic pressure is 34.8 mmHg.  3. The mitral valve is grossly normal. Mild mitral valve regurgitation. No evidence of mitral stenosis.  4. The aortic valve is tricuspid. There is mild calcification of the aortic valve. There is mild thickening of the aortic valve. Aortic valve regurgitation is not visualized. Mild aortic valve sclerosis is present, with no evidence of aortic valve stenosis.  5. The inferior vena cava is normal in size with greater than 50% respiratory variability,  suggesting right atrial pressure of 3 mmHg. FINDINGS  Left Ventricle: Left ventricular ejection fraction, by estimation, is 60 to 65%. The left  ventricle has normal function. The left ventricle has no regional wall motion abnormalities. The left ventricular internal cavity size was normal in size. There is  mild left ventricular hypertrophy. Left ventricular diastolic parameters are consistent with Grade I diastolic dysfunction (impaired relaxation). Normal left ventricular filling pressure. Right Ventricle: The right ventricular size is normal. No increase in right ventricular wall thickness. Right ventricular systolic function is normal. There is normal pulmonary artery systolic pressure. The tricuspid regurgitant velocity is 2.82 m/s, and  with an assumed right atrial pressure of 3 mmHg, the estimated right ventricular systolic pressure is 34.8 mmHg. Left Atrium: Left atrial size was normal in size. Right Atrium: Right atrial size was normal in size. Pericardium: Trivial pericardial effusion is present. Mitral Valve: The mitral valve is grossly normal. There is mild calcification of the mitral valve leaflet(s). Mild mitral valve regurgitation. No evidence of mitral valve stenosis. Tricuspid Valve: The tricuspid valve is grossly normal. Tricuspid valve regurgitation is trivial. No evidence of tricuspid stenosis. Aortic Valve: The aortic valve is tricuspid. There is mild calcification of the aortic valve. There is mild thickening of the aortic valve. Aortic valve regurgitation is not visualized. Mild aortic valve sclerosis is present, with no evidence of aortic valve stenosis. Pulmonic Valve: The pulmonic valve was grossly normal. Pulmonic valve regurgitation is not visualized. No evidence of pulmonic stenosis. Aorta: The aortic root is normal in size and structure. Venous: The inferior vena cava is normal in size with greater than 50% respiratory variability, suggesting right atrial pressure of 3 mmHg. IAS/Shunts: The atrial septum is grossly normal.  LEFT VENTRICLE PLAX 2D LVIDd:         3.60 cm  Diastology LVIDs:         2.60 cm  LV e' medial:    6.20  cm/s LV PW:         1.30 cm  LV E/e' medial:  8.7 LV IVS:        1.30 cm  LV e' lateral:   6.64 cm/s LVOT diam:     1.90 cm  LV E/e' lateral: 8.1 LV SV:         46 LV SV Index:   25 LVOT Area:     2.84 cm  RIGHT VENTRICLE RV Basal diam:  2.80 cm RV S prime:     10.00 cm/s TAPSE (M-mode): 2.7 cm LEFT ATRIUM             Index       RIGHT ATRIUM           Index LA diam:        3.30 cm 1.81 cm/m  RA Area:     13.50 cm LA Vol (A2C):   17.9 ml 9.84 ml/m  RA Volume:   34.60 ml  19.01 ml/m LA Vol (A4C):   26.8 ml 14.73 ml/m LA Biplane Vol: 22.5 ml 12.36 ml/m  AORTIC VALVE LVOT Vmax:   79.00 cm/s LVOT Vmean:  52.300 cm/s LVOT VTI:    0.163 m  AORTA Ao Root diam: 2.90 cm MITRAL VALVE               TRICUSPID VALVE MV Area (PHT): 3.99 cm    TR Peak grad:   31.8 mmHg MV Decel Time: 190 msec    TR Vmax:  282.00 cm/s MV E velocity: 54.00 cm/s MV A velocity: 79.30 cm/s  SHUNTS MV E/A ratio:  0.68        Systemic VTI:  0.16 m                            Systemic Diam: 1.90 cm Lennie Odor MD Electronically signed by Lennie Odor MD Signature Date/Time: 01/08/2020/3:34:36 PM    Final    CT HEAD CODE STROKE WO CONTRAST  Result Date: 01/07/2020 CLINICAL DATA:  Code stroke. Neuro deficit, acute stroke suspected. Slurred speech and dysphagia. EXAM: CT HEAD WITHOUT CONTRAST TECHNIQUE: Contiguous axial images were obtained from the base of the skull through the vertex without intravenous contrast. COMPARISON:  MRI and CT from a 08/26/2018 FINDINGS: Brain: Slight progression of hypoattenuation in the high right frontoparietal region, in the region of infarcts seen on prior MRI. There is suggestion of volume loss in this region additional patchy areas of white matter attenuation are similar to prior and likely represent the sequela of chronic microvascular ischemic disease or prior small infarcts. No acute hemorrhage. Vascular: No hyperdense vessel or unexpected calcification. Skull: No acute fracture. Sinuses/Orbits:  Opacification a left posterior ethmoid air cell. Scattered mucosal thickening. Unremarkable orbits. Other: No mastoid effusions. IMPRESSION: 1. Slight progression of hypoattenuation in the high right frontoparietal region. This is favored to represent evolution of infarcts seen in this region on the prior MRI given suggestion of associated volume loss; however, superimposed new/acute infarct is difficult to exclude. An MRI could further evaluate if clinically indicated. 2. No acute hemorrhage. Code stroke imaging results were communicated on 01/07/2020 at 5:35 pm to provider Dr. Wilford Corner via telephone, who verbally acknowledged these results. Electronically Signed   By: Feliberto Harts MD   On: 01/07/2020 17:39    Time Spent in minutes  30   Susa Raring M.D on 01/09/2020 at 11:03 AM  To page go to [email or web address] - password [email or web address]

## 2020-01-09 NOTE — Anesthesia Preprocedure Evaluation (Addendum)
Anesthesia Evaluation  Patient identified by MRN, date of birth, ID band Patient awake    Reviewed: Allergy & Precautions, NPO status , Patient's Chart, lab work & pertinent test results  History of Anesthesia Complications Negative for: history of anesthetic complications  Airway Mallampati: II  TM Distance: >3 FB Neck ROM: Full    Dental  (+) Dental Advisory Given   Pulmonary neg pulmonary ROS, former smoker,    breath sounds clear to auscultation       Cardiovascular hypertension, Pt. on medications  Rhythm:Regular Rate:Normal     Neuro/Psych CVA negative psych ROS   GI/Hepatic negative GI ROS, Neg liver ROS,   Endo/Other  negative endocrine ROS  Renal/GU Renal InsufficiencyRenal disease  negative genitourinary   Musculoskeletal negative musculoskeletal ROS (+)   Abdominal   Peds  Hematology negative hematology ROS (+)   Anesthesia Other Findings   Reproductive/Obstetrics                            Anesthesia Physical Anesthesia Plan  ASA: IV  Anesthesia Plan: MAC   Post-op Pain Management:    Induction: Intravenous  PONV Risk Score and Plan: 2 and Propofol infusion, TIVA and Treatment may vary due to age or medical condition  Airway Management Planned: Natural Airway, Nasal Cannula and Simple Face Mask  Additional Equipment: None  Intra-op Plan:   Post-operative Plan:   Informed Consent: I have reviewed the patients History and Physical, chart, labs and discussed the procedure including the risks, benefits and alternatives for the proposed anesthesia with the patient or authorized representative who has indicated his/her understanding and acceptance.       Plan Discussed with: CRNA  Anesthesia Plan Comments:        Anesthesia Quick Evaluation

## 2020-01-09 NOTE — CV Procedure (Signed)
TEE:  See full report in [employer] EF 50-55% basal septal hypertrophy Mild MR No SOE No LAA thrombus No ASD/PFO Negative bubble study  Dr Ladona Ridgel to proceed with ILR  Charlton Haws MD [facility]

## 2020-01-09 NOTE — Anesthesia Postprocedure Evaluation (Signed)
Anesthesia Post Note  Patient: Shannon Nixon  Procedure(s) Performed: TRANSESOPHAGEAL ECHOCARDIOGRAM (TEE) (N/A ) BUBBLE STUDY     Patient location during evaluation: PACU Anesthesia Type: MAC Level of consciousness: awake and alert Pain management: pain level controlled Vital Signs Assessment: post-procedure vital signs reviewed and stable Respiratory status: spontaneous breathing, nonlabored ventilation, respiratory function stable and patient connected to nasal cannula oxygen Cardiovascular status: stable and blood pressure returned to baseline Postop Assessment: no apparent nausea or vomiting Anesthetic complications: no   No complications documented.  Last Vitals:  Vitals:   01/09/20 1435 01/09/20 1520  BP: (!) 143/54 129/69  Pulse: 71 75  Resp: 13 16  Temp:  36.6 C  SpO2: 99% 94%    Last Pain:  Vitals:   01/09/20 1520  TempSrc: Oral  PainSc:                  [name]

## 2020-01-09 NOTE — H&P (View-Only) (Signed)
  ELECTROPHYSIOLOGY CONSULT NOTE  Patient ID: Shannon Nixon MRN: [id number], DOB/AGE: 03/23/1933   Admit date: 01/07/2020 Date of Consult: 01/09/2020  Primary Physician: Fulbright, Virginia E, PA-C Primary Cardiologist: none Reason for Consultation: Cryptogenic stroke ; recommendations regarding Implantable Loop Recorder, requested by Dr. Ahern  History of Present Illness Shannon Nixon was admitted on 01/07/2020 with aphasia and strokes.  They first developed symptoms while at home.   PMHx noted for: past strokes, HTN, HLD, OA,    Neurology noted scattered embolic strokes in bilateral cerebellar and cerebral hemispheres.  she has undergone workup for stroke including echocardiogram and carotid dopplers.  The patient has been monitored on telemetry which has demonstrated sinus rhythm with no arrhythmias.    Inpatient stroke work-up is to be completed with a TEE.   Echocardiogram this admission demonstrated  IMPRESSIONS    1. Left ventricular ejection fraction, by estimation, is 60 to 65%. The  left ventricle has normal function. The left ventricle has no regional  wall motion abnormalities. There is mild left ventricular hypertrophy.  Left ventricular diastolic parameters  are consistent with Grade I diastolic dysfunction (impaired relaxation).  2. Right ventricular systolic function is normal. The right ventricular  size is normal. There is normal pulmonary artery systolic pressure. The  estimated right ventricular systolic pressure is 34.8 mmHg.  3. The mitral valve is grossly normal. Mild mitral valve regurgitation.  No evidence of mitral stenosis.  4. The aortic valve is tricuspid. There is mild calcification of the  aortic valve. There is mild thickening of the aortic valve. Aortic valve  regurgitation is not visualized. Mild aortic valve sclerosis is present,  with no evidence of aortic valve  stenosis.  5. The inferior vena cava is normal in size with  greater than 50%  respiratory variability, suggesting right atrial pressure of 3 mmHg.    Lab work is reviewed.  Prior to admission, the patient denies chest pain, shortness of breath, dizziness, palpitations, or syncope.   I have had opportunity to talk with her daughter Shannon Nixon, who confirms, no known past cardiac history or complaints     They are recovering from their stroke with plans to CIR at discharge.    History reviewed. As above.  Surgical History:  Past Surgical History:  Procedure Laterality Date  . KNEE ARTHROSCOPY       Medications Prior to Admission  Medication Sig Dispense Refill Last Dose  . aspirin 325 MG tablet Take 325 mg by mouth daily.   01/07/2020 at Unknown time  . atorvastatin (LIPITOR) 40 MG tablet Take 40 mg by mouth daily.   01/07/2020 at Unknown time  . citalopram (CELEXA) 10 MG tablet Take 10 mg by mouth daily.   01/07/2020 at Unknown time  . COLLAGEN PO Take 1 capsule by mouth in the morning and at bedtime.   01/07/2020 at Unknown time  . gabapentin (NEURONTIN) 100 MG capsule Take 100 mg by mouth at bedtime.   01/06/2020 at Unknown time  . losartan (COZAAR) 25 MG tablet Take 25 mg by mouth daily.   01/07/2020 at Unknown time  . mirtazapine (REMERON) 15 MG tablet Take 15 mg by mouth at bedtime.   01/06/2020 at Unknown time  . Multiple Vitamins-Minerals (MULTIVITAMIN WITH MINERALS) tablet Take 1 tablet by mouth daily.   01/07/2020 at Unknown time  . omeprazole (PRILOSEC) 40 MG capsule Take 40 mg by mouth daily.   01/07/2020 at Unknown time  . traZODone (DESYREL) 100 MG tablet Take   100 mg by mouth at bedtime.   01/06/2020 at Unknown time    Inpatient Medications:  . aspirin EC  81 mg Oral Daily  . atorvastatin  40 mg Oral Daily  . enoxaparin (LOVENOX) injection  40 mg Subcutaneous Q24H  . niacin  50 mg Oral BID WC  . pantoprazole  40 mg Oral Daily    Allergies: No Known Allergies  Social History   Socioeconomic History  . Marital  status: Divorced    Spouse name: Not on file  . Number of children: Not on file  . Years of education: Not on file  . Highest education level: Not on file  Occupational History  . Not on file  Tobacco Use  . Smoking status: Former Games developer  . Smokeless tobacco: Never Used  Substance and Sexual Activity  . Alcohol use: Not Currently  . Drug use: Never  . Sexual activity: Not on file  Other Topics Concern  . Not on file  Social History Narrative  . Not on file   Social Determinants of Health   Financial Resource Strain:   . Difficulty of Paying Living Expenses: Not on file  Food Insecurity:   . Worried About Programme researcher, broadcasting/film/video in the Last Year: Not on file  . Ran Out of Food in the Last Year: Not on file  Transportation Needs:   . Lack of Transportation (Medical): Not on file  . Lack of Transportation (Non-Medical): Not on file  Physical Activity:   . Days of Exercise per Week: Not on file  . Minutes of Exercise per Session: Not on file  Stress:   . Feeling of Stress : Not on file  Social Connections:   . Frequency of Communication with Friends and Family: Not on file  . Frequency of Social Gatherings with Friends and Family: Not on file  . Attends Religious Services: Not on file  . Active Member of Clubs or Organizations: Not on file  . Attends Banker Meetings: Not on file  . Marital Status: Not on file  Intimate Partner Violence:   . Fear of Current or Ex-Partner: Not on file  . Emotionally Abused: Not on file  . Physically Abused: Not on file  . Sexually Abused: Not on file     Family History  Problem Relation Age of Onset  . Heart disease Father       Review of Systems: All other systems reviewed and are otherwise negative except as noted above.  Physical Exam: Vitals:   01/08/20 2022 01/08/20 2350 01/09/20 0402 01/09/20 0829  BP: 109/60 (!) 115/57 137/85 (!) 158/81  Pulse: 74 69 79 79  Resp: 20 18 18 18   Temp: 98.8 F (37.1 C) 97.9 F  (36.6 C) 97.8 F (36.6 C) (!) 97.5 F (36.4 C)  TempSrc: Oral Oral Oral Oral  SpO2: 95% 93% 94% 96%  Weight:        GEN- The patient is well appearing, alert and oriented, follows directions appropriately, ableto get some words out though largely aphasic.   Head- normocephalic, atraumatic Eyes-  Sclera clear, conjunctiva pink Ears- hearing intact Oropharynx- clear Neck- supple Lungs- CTA b/l, normal work of breathing Heart- RRR, no murmurs, rubs or gallops  GI- soft, NT, ND Extremities- no clubbing, cyanosis, or edema MS- no significant deformity, age appropriate atrophy Skin- no rash or lesion Psych- very pleasant    Labs:   Lab Results  Component Value Date   WBC 7.0 01/09/2020  HGB 12.0 01/09/2020   HCT 35.9 (L) 01/09/2020   MCV 96.8 01/09/2020   PLT 195 01/09/2020    Recent Labs  Lab 01/09/20 0418  NA 138  K 4.4  CL 105  CO2 22  BUN 23  CREATININE 1.31*  CALCIUM 9.4  PROT 6.5  BILITOT 1.4*  ALKPHOS 78  ALT 14  AST 34  GLUCOSE 98   No results found for: CKTOTAL, CKMB, CKMBINDEX, TROPONINI Lab Results  Component Value Date   CHOL 136 01/08/2020   Lab Results  Component Value Date   HDL 44 01/08/2020   Lab Results  Component Value Date   LDLCALC 74 01/08/2020   Lab Results  Component Value Date   TRIG 89 01/08/2020   Lab Results  Component Value Date   CHOLHDL 3.1 01/08/2020   No results found for: LDLDIRECT  No results found for: DDIMER   Radiology/Studies:   MR ANGIO HEAD WO CONTRAST Result Date: 01/07/2020 CLINICAL DATA:  Stroke follow-up. EXAM: MRA HEAD WITHOUT CONTRAST TECHNIQUE: Angiographic images of the Circle of Willis were obtained using MRA technique without intravenous contrast. COMPARISON:  MRI from the same day and MRA from 09/25/2018 FINDINGS: Similar appearance of the anterior and posterior circulation comparison to prior MRA from 09/25/2018. No evidence of hemodynamically significant stenosis or large vessel occlusion  involving the anterior posterior circulation. Mild irregularity of distal vessels likely relates to atherosclerosis. No evidence of aneurysm or vascular malformation. IMPRESSION: No evidence of proximal hemodynamically significant stenosis or large vessel occlusion.Similar appearance of the intracranial vessels in comparison to prior MRA from 09/25/2018. Electronically Signed   By: Feliberto Harts MD   On: 01/07/2020 20:30    MR BRAIN WO CONTRAST Result Date: 01/07/2020 CLINICAL DATA:  Neuro deficit, acute stroke suspected. EXAM: MRI HEAD WITHOUT CONTRAST TECHNIQUE: Multiplanar, multiecho pulse sequences of the brain and surrounding structures were obtained without intravenous contrast. COMPARISON:  MRI August 26, 2018 FINDINGS: Brain: There is an acute infarct involving the high left frontal cortex. Additional punctate infarcts involving the posterior right parietal lobe (series 5, image 84), the posterior left temporal lobe (see series 5, images 68 and 69), and right frontal cortex (series 5, image 86). There is mild edema associated with the left frontal infarct without substantial mass effect. No midline shift. No evidence of acute hemorrhage. Encephalomalacia associated with prior right frontoparietal infarct. Additional scattered T2/FLAIR hyperintensities are compatible with the sequela of prior microvascular ischemic disease. Small remote lacunar infarcts in bilateral cerebellar hemispheres. No mass lesion or abnormal mass effect. No hydrocephalus. Vascular: Proximal major arterial flow voids are maintained at the skull base. Skull and upper cervical spine: Normal marrow signal. Sinuses/Orbits: Scattered mucosal thickening without air-fluid levels in the sinuses. Negative orbits. Other: Small right greater than left mastoid effusions. IMPRESSION: 1. Acute high left frontal cortical infarct with additional punctate infarcts involving the right parietal lobe, right frontal lobe, and posterior left temporal  lobe, as detailed above. Involvement of multiple vascular territories suggests a central embolic etiology. 2. Remote infarcts and chronic microvascular ischemic disease, as described above. Findings discussed with Dr. Wilford Corner via telephone at 6:21 p.m. Electronically Signed   By: Feliberto Harts MD   On: 01/07/2020 18:23    CT HEAD CODE STROKE WO CONTRAST Result Date: 01/07/2020 CLINICAL DATA:  Code stroke. Neuro deficit, acute stroke suspected. Slurred speech and dysphagia. EXAM: CT HEAD WITHOUT CONTRAST TECHNIQUE: Contiguous axial images were obtained from the base of the skull through the vertex without  intravenous contrast. COMPARISON:  MRI and CT from a 08/26/2018 FINDINGS: Brain: Slight progression of hypoattenuation in the high right frontoparietal region, in the region of infarcts seen on prior MRI. There is suggestion of volume loss in this region additional patchy areas of white matter attenuation are similar to prior and likely represent the sequela of chronic microvascular ischemic disease or prior small infarcts. No acute hemorrhage. Vascular: No hyperdense vessel or unexpected calcification. Skull: No acute fracture. Sinuses/Orbits: Opacification a left posterior ethmoid air cell. Scattered mucosal thickening. Unremarkable orbits. Other: No mastoid effusions. IMPRESSION: 1. Slight progression of hypoattenuation in the high right frontoparietal region. This is favored to represent evolution of infarcts seen in this region on the prior MRI given suggestion of associated volume loss; however, superimposed new/acute infarct is difficult to exclude. An MRI could further evaluate if clinically indicated. 2. No acute hemorrhage. Code stroke imaging results were communicated on 01/07/2020 at 5:35 pm to provider Dr. Wilford Corner via telephone, who verbally acknowledged these results. Electronically Signed   By: Feliberto Harts MD   On: 01/07/2020 17:39    12-lead ECG SR All prior EKG's in EPIC reviewed with  no documented atrial fibrillation  Telemetry SR/ST  Assessment and Plan:  1. Cryptogenic stroke The patient presents with cryptogenic stroke.  The patient has a TEE planned for today.  I spoke at length with the patient about monitoring for afib with either a 30 day event monitor or an implantable loop recorder.  Risks, benefits, and alteratives to implantable loop recorder were discussed with the patient today.   Dr. Ladona Ridgel has seen the patient.  She follows commands appropriately and appears to have good understanding, though significant aphasia.She answers yes and no, it appears appropriately and very easily told me the name of her daughter. I have spoken t the patient's daughter (at the patient's request) via telephone rational for heart rhythm monitoring/AFib surveillance with her Mom's stroke.  Also discussed the procedure, potential risks and benefits.  She and the patient both are agreeable.  Pending TEE  Wound care was reviewed with the patient (keep incision clean and dry for 3 days).  Wound check scheduled for the patient  Please call with questions.   Renee Norberto Sorenson, PA-C 01/09/2020  EP Attending  Patient seen and examined. Agree with above. The patient presents with a cryptogenic stroke. No obvious cause. She has undergone TEE. No obvious clot. She will undergo ILR insertion. I have discussed the indication/risks/benefits/goals/expectations and she wishes to proceed.  Sharlot Gowda Aws Shere,MD

## 2020-01-09 NOTE — Consult Note (Addendum)
ELECTROPHYSIOLOGY CONSULT NOTE  Patient ID: Shannon BastMargaret Medel MRN: [id number], DOB/AGE: [birth year]   Admit date: 01/07/2020 Date of Consult: 01/09/2020  Primary Physician: Burnis MedinFulbright, Virginia E, PA-C Primary Cardiologist: none Reason for Consultation: Cryptogenic stroke ; recommendations regarding Implantable Loop Recorder, requested by Dr. Lucia GaskinsAhern  History of Present Illness Shannon Nixon was admitted on 01/07/2020 with aphasia and strokes.  They first developed symptoms while at home.   PMHx noted for: past strokes, HTN, HLD, OA,    Neurology noted scattered embolic strokes in bilateral cerebellar and cerebral hemispheres.  she has undergone workup for stroke including echocardiogram and carotid dopplers.  The patient has been monitored on telemetry which has demonstrated sinus rhythm with no arrhythmias.    Inpatient stroke work-up is to be completed with a TEE.   Echocardiogram this admission demonstrated  IMPRESSIONS    1. Left ventricular ejection fraction, by estimation, is 60 to 65%. The  left ventricle has normal function. The left ventricle has no regional  wall motion abnormalities. There is mild left ventricular hypertrophy.  Left ventricular diastolic parameters  are consistent with Grade I diastolic dysfunction (impaired relaxation).  2. Right ventricular systolic function is normal. The right ventricular  size is normal. There is normal pulmonary artery systolic pressure. The  estimated right ventricular systolic pressure is 34.8 mmHg.  3. The mitral valve is grossly normal. Mild mitral valve regurgitation.  No evidence of mitral stenosis.  4. The aortic valve is tricuspid. There is mild calcification of the  aortic valve. There is mild thickening of the aortic valve. Aortic valve  regurgitation is not visualized. Mild aortic valve sclerosis is present,  with no evidence of aortic valve  stenosis.  5. The inferior vena cava is normal in size with  greater than 50%  respiratory variability, suggesting right atrial pressure of 3 mmHg.    Lab work is reviewed.  Prior to admission, the patient denies chest pain, shortness of breath, dizziness, palpitations, or syncope.   I have had opportunity to talk with her daughter Newton Piggpauline Finch, who confirms, no known past cardiac history or complaints     They are recovering from their stroke with plans to CIR at discharge.    History reviewed. As above.  Surgical History:  Past Surgical History:  Procedure Laterality Date  . KNEE ARTHROSCOPY       Medications Prior to Admission  Medication Sig Dispense Refill Last Dose  . aspirin 325 MG tablet Take 325 mg by mouth daily.   01/07/2020 at Unknown time  . atorvastatin (LIPITOR) 40 MG tablet Take 40 mg by mouth daily.   01/07/2020 at Unknown time  . citalopram (CELEXA) 10 MG tablet Take 10 mg by mouth daily.   01/07/2020 at Unknown time  . COLLAGEN PO Take 1 capsule by mouth in the morning and at bedtime.   01/07/2020 at Unknown time  . gabapentin (NEURONTIN) 100 MG capsule Take 100 mg by mouth at bedtime.   01/06/2020 at Unknown time  . losartan (COZAAR) 25 MG tablet Take 25 mg by mouth daily.   01/07/2020 at Unknown time  . mirtazapine (REMERON) 15 MG tablet Take 15 mg by mouth at bedtime.   01/06/2020 at Unknown time  . Multiple Vitamins-Minerals (MULTIVITAMIN WITH MINERALS) tablet Take 1 tablet by mouth daily.   01/07/2020 at Unknown time  . omeprazole (PRILOSEC) 40 MG capsule Take 40 mg by mouth daily.   01/07/2020 at Unknown time  . traZODone (DESYREL) 100 MG tablet Take  100 mg by mouth at bedtime.   01/06/2020 at Unknown time    Inpatient Medications:  . aspirin EC  81 mg Oral Daily  . atorvastatin  40 mg Oral Daily  . enoxaparin (LOVENOX) injection  40 mg Subcutaneous Q24H  . niacin  50 mg Oral BID WC  . pantoprazole  40 mg Oral Daily    Allergies: No Known Allergies  Social History   Socioeconomic History  . Marital  status: Divorced    Spouse name: Not on file  . Number of children: Not on file  . Years of education: Not on file  . Highest education level: Not on file  Occupational History  . Not on file  Tobacco Use  . Smoking status: Former Games developer  . Smokeless tobacco: Never Used  Substance and Sexual Activity  . Alcohol use: Not Currently  . Drug use: Never  . Sexual activity: Not on file  Other Topics Concern  . Not on file  Social History Narrative  . Not on file   Social Determinants of Health   Financial Resource Strain:   . Difficulty of Paying Living Expenses: Not on file  Food Insecurity:   . Worried About Programme researcher, broadcasting/film/video in the Last Year: Not on file  . Ran Out of Food in the Last Year: Not on file  Transportation Needs:   . Lack of Transportation (Medical): Not on file  . Lack of Transportation (Non-Medical): Not on file  Physical Activity:   . Days of Exercise per Week: Not on file  . Minutes of Exercise per Session: Not on file  Stress:   . Feeling of Stress : Not on file  Social Connections:   . Frequency of Communication with Friends and Family: Not on file  . Frequency of Social Gatherings with Friends and Family: Not on file  . Attends Religious Services: Not on file  . Active Member of Clubs or Organizations: Not on file  . Attends Banker Meetings: Not on file  . Marital Status: Not on file  Intimate Partner Violence:   . Fear of Current or Ex-Partner: Not on file  . Emotionally Abused: Not on file  . Physically Abused: Not on file  . Sexually Abused: Not on file     Family History  Problem Relation Age of Onset  . Heart disease Father       Review of Systems: All other systems reviewed and are otherwise negative except as noted above.  Physical Exam: Vitals:   01/08/20 2022 01/08/20 2350 01/09/20 0402 01/09/20 0829  BP: 109/60 (!) 115/57 137/85 (!) 158/81  Pulse: 74 69 79 79  Resp: 20 18 18 18   Temp: 98.8 F (37.1 C) 97.9 F  (36.6 C) 97.8 F (36.6 C) (!) 97.5 F (36.4 C)  TempSrc: Oral Oral Oral Oral  SpO2: 95% 93% 94% 96%  Weight:        GEN- The patient is well appearing, alert and oriented, follows directions appropriately, ableto get some words out though largely aphasic.   Head- normocephalic, atraumatic Eyes-  Sclera clear, conjunctiva pink Ears- hearing intact Oropharynx- clear Neck- supple Lungs- CTA b/l, normal work of breathing Heart- RRR, no murmurs, rubs or gallops  GI- soft, NT, ND Extremities- no clubbing, cyanosis, or edema MS- no significant deformity, age appropriate atrophy Skin- no rash or lesion Psych- very pleasant    Labs:   Lab Results  Component Value Date   WBC 7.0 01/09/2020  HGB 12.0 01/09/2020   HCT 35.9 (L) 01/09/2020   MCV 96.8 01/09/2020   PLT 195 01/09/2020    Recent Labs  Lab 01/09/20 0418  NA 138  K 4.4  CL 105  CO2 22  BUN 23  CREATININE 1.31*  CALCIUM 9.4  PROT 6.5  BILITOT 1.4*  ALKPHOS 78  ALT 14  AST 34  GLUCOSE 98   No results found for: CKTOTAL, CKMB, CKMBINDEX, TROPONINI Lab Results  Component Value Date   CHOL 136 01/08/2020   Lab Results  Component Value Date   HDL 44 01/08/2020   Lab Results  Component Value Date   LDLCALC 74 01/08/2020   Lab Results  Component Value Date   TRIG 89 01/08/2020   Lab Results  Component Value Date   CHOLHDL 3.1 01/08/2020   No results found for: LDLDIRECT  No results found for: DDIMER   Radiology/Studies:   MR ANGIO HEAD WO CONTRAST Result Date: 01/07/2020 CLINICAL DATA:  Stroke follow-up. EXAM: MRA HEAD WITHOUT CONTRAST TECHNIQUE: Angiographic images of the Circle of Willis were obtained using MRA technique without intravenous contrast. COMPARISON:  MRI from the same day and MRA from 09/25/2018 FINDINGS: Similar appearance of the anterior and posterior circulation comparison to prior MRA from 09/25/2018. No evidence of hemodynamically significant stenosis or large vessel occlusion  involving the anterior posterior circulation. Mild irregularity of distal vessels likely relates to atherosclerosis. No evidence of aneurysm or vascular malformation. IMPRESSION: No evidence of proximal hemodynamically significant stenosis or large vessel occlusion.Similar appearance of the intracranial vessels in comparison to prior MRA from 09/25/2018. Electronically Signed   By: Feliberto Harts MD   On: 01/07/2020 20:30    MR BRAIN WO CONTRAST Result Date: 01/07/2020 CLINICAL DATA:  Neuro deficit, acute stroke suspected. EXAM: MRI HEAD WITHOUT CONTRAST TECHNIQUE: Multiplanar, multiecho pulse sequences of the brain and surrounding structures were obtained without intravenous contrast. COMPARISON:  MRI August 26, 2018 FINDINGS: Brain: There is an acute infarct involving the high left frontal cortex. Additional punctate infarcts involving the posterior right parietal lobe (series 5, image 84), the posterior left temporal lobe (see series 5, images 68 and 69), and right frontal cortex (series 5, image 86). There is mild edema associated with the left frontal infarct without substantial mass effect. No midline shift. No evidence of acute hemorrhage. Encephalomalacia associated with prior right frontoparietal infarct. Additional scattered T2/FLAIR hyperintensities are compatible with the sequela of prior microvascular ischemic disease. Small remote lacunar infarcts in bilateral cerebellar hemispheres. No mass lesion or abnormal mass effect. No hydrocephalus. Vascular: Proximal major arterial flow voids are maintained at the skull base. Skull and upper cervical spine: Normal marrow signal. Sinuses/Orbits: Scattered mucosal thickening without air-fluid levels in the sinuses. Negative orbits. Other: Small right greater than left mastoid effusions. IMPRESSION: 1. Acute high left frontal cortical infarct with additional punctate infarcts involving the right parietal lobe, right frontal lobe, and posterior left temporal  lobe, as detailed above. Involvement of multiple vascular territories suggests a central embolic etiology. 2. Remote infarcts and chronic microvascular ischemic disease, as described above. Findings discussed with Dr. Wilford Corner via telephone at 6:21 p.m. Electronically Signed   By: Feliberto Harts MD   On: 01/07/2020 18:23    CT HEAD CODE STROKE WO CONTRAST Result Date: 01/07/2020 CLINICAL DATA:  Code stroke. Neuro deficit, acute stroke suspected. Slurred speech and dysphagia. EXAM: CT HEAD WITHOUT CONTRAST TECHNIQUE: Contiguous axial images were obtained from the base of the skull through the vertex without  intravenous contrast. COMPARISON:  MRI and CT from a 08/26/2018 FINDINGS: Brain: Slight progression of hypoattenuation in the high right frontoparietal region, in the region of infarcts seen on prior MRI. There is suggestion of volume loss in this region additional patchy areas of white matter attenuation are similar to prior and likely represent the sequela of chronic microvascular ischemic disease or prior small infarcts. No acute hemorrhage. Vascular: No hyperdense vessel or unexpected calcification. Skull: No acute fracture. Sinuses/Orbits: Opacification a left posterior ethmoid air cell. Scattered mucosal thickening. Unremarkable orbits. Other: No mastoid effusions. IMPRESSION: 1. Slight progression of hypoattenuation in the high right frontoparietal region. This is favored to represent evolution of infarcts seen in this region on the prior MRI given suggestion of associated volume loss; however, superimposed new/acute infarct is difficult to exclude. An MRI could further evaluate if clinically indicated. 2. No acute hemorrhage. Code stroke imaging results were communicated on 01/07/2020 at 5:35 pm to provider Dr. Wilford Corner via telephone, who verbally acknowledged these results. Electronically Signed   By: Feliberto Harts MD   On: 01/07/2020 17:39    12-lead ECG SR All prior EKG's in EPIC reviewed with  no documented atrial fibrillation  Telemetry SR/ST  Assessment and Plan:  1. Cryptogenic stroke The patient presents with cryptogenic stroke.  The patient has a TEE planned for today.  I spoke at length with the patient about monitoring for afib with either a 30 day event monitor or an implantable loop recorder.  Risks, benefits, and alteratives to implantable loop recorder were discussed with the patient today.   Dr. Ladona Ridgel has seen the patient.  She follows commands appropriately and appears to have good understanding, though significant aphasia.She answers yes and no, it appears appropriately and very easily told me the name of her daughter. I have spoken t the patient's daughter (at the patient's request) via telephone rational for heart rhythm monitoring/AFib surveillance with her Mom's stroke.  Also discussed the procedure, potential risks and benefits.  She and the patient both are agreeable.  Pending TEE  Wound care was reviewed with the patient (keep incision clean and dry for 3 days).  Wound check scheduled for the patient  Please call with questions.   Renee Norberto Sorenson, PA-C 01/09/2020  EP Attending  Patient seen and examined. Agree with above. The patient presents with a cryptogenic stroke. No obvious cause. She has undergone TEE. No obvious clot. She will undergo ILR insertion. I have discussed the indication/risks/benefits/goals/expectations and she wishes to proceed.  Sharlot Gowda Karlei Waldo,MD

## 2020-01-09 NOTE — Interval H&P Note (Signed)
History and Physical Interval Note:  01/09/2020 1:36 PM  Shannon Nixon  has presented today for surgery, with the diagnosis of STROKE.  The various methods of treatment have been discussed with the patient and family. After consideration of risks, benefits and other options for treatment, the patient has consented to  Procedure(s): TRANSESOPHAGEAL ECHOCARDIOGRAM (TEE) (N/A) as a surgical intervention.  The patient's history has been reviewed, patient examined, no change in status, stable for surgery.  I have reviewed the patient's chart and labs.  Questions were answered to the patient's satisfaction.     Charlton Haws

## 2020-01-09 NOTE — Discharge Instructions (Signed)
Wound care instructions (heart monitor implant) Keep incision clean and dry for 3 days. You can remove outer dressing tomorrow. Leave steri-strips (little pieces of tape) on until seen in the office for wound check appointment. Call the office ([phone]) for redness, drainage, swelling, or fever.  

## 2020-01-09 NOTE — Interval H&P Note (Signed)
History and Physical Interval Note:  01/09/2020 2:29 PM  Shannon Nixon  has presented today for surgery, with the diagnosis of stroke.  The various methods of treatment have been discussed with the patient and family. After consideration of risks, benefits and other options for treatment, the patient has consented to  Procedure(s): LOOP RECORDER INSERTION (N/A) as a surgical intervention.  The patient's history has been reviewed, patient examined, no change in status, stable for surgery.  I have reviewed the patient's chart and labs.  Questions were answered to the patient's satisfaction.     [name]

## 2020-01-09 NOTE — H&P (View-Only) (Signed)
        PROGRESS NOTE                                                                                                                                                                                                             Patient Demographics:    Shannon Nixon, is a 86 y.o. female, DOB - 06/12/1933, MRN:[id number]  Admit date - 01/07/2020   Admitting Physician Alexander B Melvin, MD  Outpatient Primary MD for the patient is Fulbright, Virginia E, PA-C  LOS - 2  Chief Complaint  Patient presents with  . Code Stroke       Brief Narrative (HPI from H&P) - Shannon Nixon is a 86 y.o. female with medical history significant of prior strokes most recently in 2020 at [facility], hypertension, OA, hyperlipidemia, CKD, adjustment disorder with depressed mood, and adjustment insomnia presented following onset of aphasia at home, upon arrival to the ER her NIH stroke scale was 3, she was seen by neurology and underwent MRI showing bihemispheric CVA suspicious for cardioembolic source, she was admitted for further treatment.   Subjective:    Shannon Nixon today in bed, appears to be comfortable, she has expressive aphasia and unable to answer questions reliably but able to follow simple commands.   Assessment  & Plan :     1.  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  Neurology on board.  Allow permissive hypertension, currently on aspirin 81 mg and statin 40 mg per cardiology.  Full stroke work-up underway.  Defer further management to cardiology team.  LDL is near goal at 78 and will added niacin to statin.  No documented history of A. Fib and recent work-up with Zio patch at [facility] did not reveal A. fib either.  Per neurology she will undergo leg venous ultrasound, carotid ultrasound, TEE and loop recorder placement this admission.  EP following.  No results found for:  HGBA1C  Lab Results  Component Value Date   CHOL 136 01/08/2020   HDL 44 01/08/2020   LDLCALC 74 01/08/2020   TRIG 89 01/08/2020   CHOLHDL 3.1 01/08/2020      2.  Dyslipidemia.  On statin, niacin added as LDL slightly above goal.  3.  GERD.  On PPI.  4.  CKD 3B.  Baseline creatinine appears anywhere between 1.3-1.8.  Monitor.  Check from [facility] results.     Condition - Extremely Guarded  Family Communication  :    Daughter - [phone] 01/08/20  Code Status :  Full  Consults  :  Neuro, EP  Procedures  :    MRI-A - 1. Acute high left frontal cortical infarct with additional punctate infarcts involving the right parietal lobe, right frontal lobe, and posterior left temporal lobe, as detailed above. Involvement of multiple vascular territories suggests a central embolic etiology. 2. Remote infarcts and chronic microvascular ischemic disease, as described above.  TTE -   TEE  Loop recorder -   Carotid US  -   Leg US -     PUD Prophylaxis : PPI  Disposition Plan  :    Status is: Inpatient  Remains inpatient appropriate because:IV treatments appropriate due to intensity of illness or inability to take PO   Dispo: The patient is from: Home              Anticipated d/c is to: Home              Anticipated d/c date is: 2 days              Patient currently is not medically stable to d/c.   DVT Prophylaxis  :  Lovenox   Lab Results  Component Value Date   PLT 195 01/09/2020    Diet :  Diet Order            Diet NPO time specified Except for: Sips with Meds  Diet effective midnight                  Inpatient Medications Scheduled Meds: . aspirin EC  81 mg Oral Daily  . atorvastatin  40 mg Oral Daily  . enoxaparin (LOVENOX) injection  40 mg Subcutaneous Q24H  . niacin  50 mg Oral BID WC  . pantoprazole  40 mg Oral Daily   Continuous Infusions: PRN Meds:.acetaminophen **OR** acetaminophen (TYLENOL) oral liquid 160 mg/5 mL **OR**  acetaminophen, senna-docusate  Antibiotics  :   Anti-infectives (From admission, onward)   None          Objective:   Vitals:   01/08/20 2022 01/08/20 2350 01/09/20 0402 01/09/20 0829  BP: 109/60 (!) 115/57 137/85 (!) 158/81  Pulse: 74 69 79 79  Resp: 20 18 18 18  Temp: 98.8 F (37.1 C) 97.9 F (36.6 C) 97.8 F (36.6 C) (!) 97.5 F (36.4 C)  TempSrc: Oral Oral Oral Oral  SpO2: 95% 93% 94% 96%  Weight:        SpO2: 96 %  Wt Readings from Last 3 Encounters:  01/07/20 74.6 kg  02/06/18 63.5 kg    No intake or output data in the 24 hours ending 01/09/20 1103   Physical Exam  Awake Alert, expressive aphasia,  old contracture and weakness in the left arm [place].AT,PERRAL Supple Neck,No JVD, No cervical lymphadenopathy appriciated.  Symmetrical Chest wall movement, Good air movement bilaterally, CTAB RRR,No Gallops, Rubs or new Murmurs, No Parasternal Heave +ve B.Sounds, Abd Soft, No tenderness, No organomegaly appriciated, No rebound - guarding or rigidity. No Cyanosis, Clubbing or edema, No new Rash or bruise      Data Review:   Recent Labs  Lab 01/07/20 1722 01/07/20 1723 01/09/20 0418  WBC  --  10.3 7.0  HGB 12.9 12.8 12.0  HCT 38.0 40.3 35.9*  PLT  --  181 195  MCV  --  99.8 96.8  MCH  --  31.7 32.3  MCHC  --  31.8 33.4  RDW  --    13.7 13.8  LYMPHSABS  --  2.2 2.1  MONOABS  --  1.6* 1.1*  EOSABS  --  0.2 0.2  BASOSABS  --  0.0 0.0    Recent Labs  Lab 01/07/20 1722 01/07/20 1723 01/08/20 0231 01/09/20 0418  NA 139 138  --  138  K 4.4 4.3  --  4.4  CL 106 104  --  105  CO2  --  21*  --  22  GLUCOSE 106* 107*  --  98  BUN 25* 24*  --  23  CREATININE 1.40* 1.44*  --  1.31*  CALCIUM  --  9.7  --  9.4  AST  --  33  --  34  ALT  --  17  --  14  ALKPHOS  --  80  --  78  BILITOT  --  1.3*  --  1.4*  ALBUMIN  --  4.3  --  3.7  MG  --   --   --  1.8  PROCALCITON  --   --  <0.10 <0.10  INR  --  1.1  --   --   BNP  --   --   --  98.0     Recent Labs  Lab 01/07/20 1740 01/08/20 0231 01/09/20 0418  BNP  --   --  98.0  PROCALCITON  --  <0.10 <0.10  SARSCOV2NAA NEGATIVE  --   --     ------------------------------------------------------------------------------------------------------------------ Recent Labs    01/08/20 0231  CHOL 136  HDL 44  LDLCALC 74  TRIG 89  CHOLHDL 3.1    No results found for: HGBA1C ------------------------------------------------------------------------------------------------------------------ No results for input(s): TSH, T4TOTAL, T3FREE, THYROIDAB in the last 72 hours.  Invalid input(s): FREET3 ------------------------------------------------------------------------------------------------------------------ No results for input(s): VITAMINB12, FOLATE, FERRITIN, TIBC, IRON, RETICCTPCT in the last 72 hours.  Coagulation profile Recent Labs  Lab 01/07/20 1723  INR 1.1    No results for input(s): DDIMER in the last 72 hours.  Cardiac Enzymes No results for input(s): CKMB, TROPONINI, MYOGLOBIN in the last 168 hours.  Invalid input(s): CK ------------------------------------------------------------------------------------------------------------------    Component Value Date/Time   BNP 98.0 01/09/2020 0418    Micro Results Recent Results (from the past 240 hour(s))  Respiratory Panel by RT PCR (Flu A&B, Covid) - Nasopharyngeal Swab     Status: None   Collection Time: 01/07/20  5:40 PM   Specimen: Nasopharyngeal Swab  Result Value Ref Range Status   SARS Coronavirus 2 by RT PCR NEGATIVE NEGATIVE Final    Comment: (NOTE) SARS-CoV-2 target nucleic acids are NOT DETECTED.  The SARS-CoV-2 RNA is generally detectable in upper respiratoy specimens during the acute phase of infection. The lowest concentration of SARS-CoV-2 viral copies this assay can detect is 131 copies/mL. A negative result does not preclude SARS-Cov-2 infection and should not be used as the sole basis  for treatment or other patient management decisions. A negative result may occur with  improper specimen collection/handling, submission of specimen other than nasopharyngeal swab, presence of viral mutation(s) within the areas targeted by this assay, and inadequate number of viral copies (<131 copies/mL). A negative result must be combined with clinical observations, patient history, and epidemiological information. The expected result is Negative.  Fact Sheet for Patients:  https://www.fda.gov/media/142436/download  Fact Sheet for Healthcare Providers:  https://www.fda.gov/media/142435/download  This test is no t yet approved or cleared by the [place] FDA and  has been authorized for detection and/or diagnosis of SARS-CoV-2 by FDA under   an Emergency Use Authorization (EUA). This EUA will remain  in effect (meaning this test can be used) for the duration of the COVID-19 declaration under Section 564(b)(1) of the Act, 21 U.S.C. section 360bbb-3(b)(1), unless the authorization is terminated or revoked sooner.     Influenza A by PCR NEGATIVE NEGATIVE Final   Influenza B by PCR NEGATIVE NEGATIVE Final    Comment: (NOTE) The Xpert Xpress SARS-CoV-2/FLU/RSV assay is intended as an aid in  the diagnosis of influenza from Nasopharyngeal swab specimens and  should not be used as a sole basis for treatment. Nasal washings and  aspirates are unacceptable for Xpert Xpress SARS-CoV-2/FLU/RSV  testing.  Fact Sheet for Patients: https://www.fda.gov/media/142436/download  Fact Sheet for Healthcare Providers: https://www.fda.gov/media/142435/download  This test is not yet approved or cleared by the [place] FDA and  has been authorized for detection and/or diagnosis of SARS-CoV-2 by  FDA under an Emergency Use Authorization (EUA). This EUA will remain  in effect (meaning this test can be used) for the duration of the  Covid-19 declaration under Section 564(b)(1) of the Act, 21   U.S.C. section 360bbb-3(b)(1), unless the authorization is  terminated or revoked. Performed at [facility], [address] [place], [place] [address]   Culture, blood (routine x 2)     Status: None (Preliminary result)   Collection Time: 01/08/20  2:35 AM   Specimen: BLOOD  Result Value Ref Range Status   Specimen Description BLOOD RIGHT ANTECUBITAL  Final   Special Requests   Final    BOTTLES DRAWN AEROBIC AND ANAEROBIC Blood Culture adequate volume   Culture   Final    NO GROWTH < 12 HOURS Performed at [facility], [address] [place], [place] [address]    Report Status PENDING  Incomplete  Culture, blood (routine x 2)     Status: None (Preliminary result)   Collection Time: 01/08/20  2:41 AM   Specimen: BLOOD  Result Value Ref Range Status   Specimen Description BLOOD LEFT ANTECUBITAL  Final   Special Requests   Final    BOTTLES DRAWN AEROBIC AND ANAEROBIC Blood Culture adequate volume   Culture   Final    NO GROWTH < 12 HOURS Performed at  [facility], [address] [place], [place] [address]    Report Status PENDING  Incomplete    Radiology Reports MR ANGIO HEAD WO CONTRAST  Result Date: 01/07/2020 CLINICAL DATA:  Stroke follow-up. EXAM: MRA HEAD WITHOUT CONTRAST TECHNIQUE: Angiographic images of the Circle of Willis were obtained using MRA technique without intravenous contrast. COMPARISON:  MRI from the same day and MRA from 09/25/2018 FINDINGS: Similar appearance of the anterior and posterior circulation comparison to prior MRA from 09/25/2018. No evidence of hemodynamically significant stenosis or large vessel occlusion involving the anterior posterior circulation. Mild irregularity of distal vessels likely relates to atherosclerosis. No evidence of aneurysm or vascular malformation. IMPRESSION: No evidence of proximal hemodynamically significant stenosis or large vessel occlusion.Similar appearance of the intracranial vessels in comparison to  prior MRA from 09/25/2018. Electronically Signed   By: Frederick S Jones MD   On: 01/07/2020 20:30   MR BRAIN WO CONTRAST  Result Date: 01/07/2020 CLINICAL DATA:  Neuro deficit, acute stroke suspected. EXAM: MRI HEAD WITHOUT CONTRAST TECHNIQUE: Multiplanar, multiecho pulse sequences of the brain and surrounding structures were obtained without intravenous contrast. COMPARISON:  MRI August 26, 2018 FINDINGS: Brain: There is an acute infarct involving the high left frontal cortex. Additional   punctate infarcts involving the posterior right parietal lobe (series 5, image 84), the posterior left temporal lobe (see series 5, images 68 and 69), and right frontal cortex (series 5, image 86). There is mild edema associated with the left frontal infarct without substantial mass effect. No midline shift. No evidence of acute hemorrhage. Encephalomalacia associated with prior right frontoparietal infarct. Additional scattered T2/FLAIR hyperintensities are compatible with the sequela of prior microvascular ischemic disease. Small remote lacunar infarcts in bilateral cerebellar hemispheres. No mass lesion or abnormal mass effect. No hydrocephalus. Vascular: Proximal major arterial flow voids are maintained at the skull base. Skull and upper cervical spine: Normal marrow signal. Sinuses/Orbits: Scattered mucosal thickening without air-fluid levels in the sinuses. Negative orbits. Other: Small right greater than left mastoid effusions. IMPRESSION: 1. Acute high left frontal cortical infarct with additional punctate infarcts involving the right parietal lobe, right frontal lobe, and posterior left temporal lobe, as detailed above. Involvement of multiple vascular territories suggests a central embolic etiology. 2. Remote infarcts and chronic microvascular ischemic disease, as described above. Findings discussed with Dr. Arora via telephone at 6:21 p.m. Electronically Signed   By: Frederick S Jones MD   On: 01/07/2020 18:23    ECHOCARDIOGRAM COMPLETE  Result Date: 01/08/2020    ECHOCARDIOGRAM REPORT   Patient Name:   Krystyl Delancey Date of Exam: 01/08/2020 Medical Rec #:  [id number]         Height:       65.0 in Accession #:    [id number]        Weight:       164.4 lb Date of Birth:  01/08/1934         BSA:          1.820 m Patient Age:    86 years          BP:           121/74 mmHg Patient Gender: F                 HR:           86 bpm. Exam Location:  Inpatient Procedure: 2D Echo, Cardiac Doppler and Color Doppler Indications:    CVA  History:        Patient has no prior history of Echocardiogram examinations.                 Stroke, Signs/Symptoms:Altered Mental Status; Risk                 Factors:Hypertension and Dyslipidemia.  Sonographer:    Brooke Strickland Referring Phys: [id number] ALEXANDER B MELVIN IMPRESSIONS  1. Left ventricular ejection fraction, by estimation, is 60 to 65%. The left ventricle has normal function. The left ventricle has no regional wall motion abnormalities. There is mild left ventricular hypertrophy. Left ventricular diastolic parameters are consistent with Grade I diastolic dysfunction (impaired relaxation).  2. Right ventricular systolic function is normal. The right ventricular size is normal. There is normal pulmonary artery systolic pressure. The estimated right ventricular systolic pressure is 34.8 mmHg.  3. The mitral valve is grossly normal. Mild mitral valve regurgitation. No evidence of mitral stenosis.  4. The aortic valve is tricuspid. There is mild calcification of the aortic valve. There is mild thickening of the aortic valve. Aortic valve regurgitation is not visualized. Mild aortic valve sclerosis is present, with no evidence of aortic valve stenosis.  5. The inferior vena cava is normal in size with greater than 50% respiratory variability,   suggesting right atrial pressure of 3 mmHg. FINDINGS  Left Ventricle: Left ventricular ejection fraction, by estimation, is 60 to 65%. The left  ventricle has normal function. The left ventricle has no regional wall motion abnormalities. The left ventricular internal cavity size was normal in size. There is  mild left ventricular hypertrophy. Left ventricular diastolic parameters are consistent with Grade I diastolic dysfunction (impaired relaxation). Normal left ventricular filling pressure. Right Ventricle: The right ventricular size is normal. No increase in right ventricular wall thickness. Right ventricular systolic function is normal. There is normal pulmonary artery systolic pressure. The tricuspid regurgitant velocity is 2.82 m/s, and  with an assumed right atrial pressure of 3 mmHg, the estimated right ventricular systolic pressure is 34.8 mmHg. Left Atrium: Left atrial size was normal in size. Right Atrium: Right atrial size was normal in size. Pericardium: Trivial pericardial effusion is present. Mitral Valve: The mitral valve is grossly normal. There is mild calcification of the mitral valve leaflet(s). Mild mitral valve regurgitation. No evidence of mitral valve stenosis. Tricuspid Valve: The tricuspid valve is grossly normal. Tricuspid valve regurgitation is trivial. No evidence of tricuspid stenosis. Aortic Valve: The aortic valve is tricuspid. There is mild calcification of the aortic valve. There is mild thickening of the aortic valve. Aortic valve regurgitation is not visualized. Mild aortic valve sclerosis is present, with no evidence of aortic valve stenosis. Pulmonic Valve: The pulmonic valve was grossly normal. Pulmonic valve regurgitation is not visualized. No evidence of pulmonic stenosis. Aorta: The aortic root is normal in size and structure. Venous: The inferior vena cava is normal in size with greater than 50% respiratory variability, suggesting right atrial pressure of 3 mmHg. IAS/Shunts: The atrial septum is grossly normal.  LEFT VENTRICLE PLAX 2D LVIDd:         3.60 cm  Diastology LVIDs:         2.60 cm  LV e' medial:    6.20  cm/s LV PW:         1.30 cm  LV E/e' medial:  8.7 LV IVS:        1.30 cm  LV e' lateral:   6.64 cm/s LVOT diam:     1.90 cm  LV E/e' lateral: 8.1 LV SV:         46 LV SV Index:   25 LVOT Area:     2.84 cm  RIGHT VENTRICLE RV Basal diam:  2.80 cm RV S prime:     10.00 cm/s TAPSE (M-mode): 2.7 cm LEFT ATRIUM             Index       RIGHT ATRIUM           Index LA diam:        3.30 cm 1.81 cm/m  RA Area:     13.50 cm LA Vol (A2C):   17.9 ml 9.84 ml/m  RA Volume:   34.60 ml  19.01 ml/m LA Vol (A4C):   26.8 ml 14.73 ml/m LA Biplane Vol: 22.5 ml 12.36 ml/m  AORTIC VALVE LVOT Vmax:   79.00 cm/s LVOT Vmean:  52.300 cm/s LVOT VTI:    0.163 m  AORTA Ao Root diam: 2.90 cm MITRAL VALVE               TRICUSPID VALVE MV Area (PHT): 3.99 cm    TR Peak grad:   31.8 mmHg MV Decel Time: 190 msec    TR Vmax:          282.00 cm/s MV E velocity: 54.00 cm/s MV A velocity: 79.30 cm/s  SHUNTS MV E/A ratio:  0.68        Systemic VTI:  0.16 m                            Systemic Diam: 1.90 cm [place] O'Neal MD Electronically signed by [place] O'Neal MD Signature Date/Time: 01/08/2020/3:34:36 PM    Final    CT HEAD CODE STROKE WO CONTRAST  Result Date: 01/07/2020 CLINICAL DATA:  Code stroke. Neuro deficit, acute stroke suspected. Slurred speech and dysphagia. EXAM: CT HEAD WITHOUT CONTRAST TECHNIQUE: Contiguous axial images were obtained from the base of the skull through the vertex without intravenous contrast. COMPARISON:  MRI and CT from a 08/26/2018 FINDINGS: Brain: Slight progression of hypoattenuation in the high right frontoparietal region, in the region of infarcts seen on prior MRI. There is suggestion of volume loss in this region additional patchy areas of white matter attenuation are similar to prior and likely represent the sequela of chronic microvascular ischemic disease or prior small infarcts. No acute hemorrhage. Vascular: No hyperdense vessel or unexpected calcification. Skull: No acute fracture. Sinuses/Orbits:  Opacification a left posterior ethmoid air cell. Scattered mucosal thickening. Unremarkable orbits. Other: No mastoid effusions. IMPRESSION: 1. Slight progression of hypoattenuation in the high right frontoparietal region. This is favored to represent evolution of infarcts seen in this region on the prior MRI given suggestion of associated volume loss; however, superimposed new/acute infarct is difficult to exclude. An MRI could further evaluate if clinically indicated. 2. No acute hemorrhage. Code stroke imaging results were communicated on 01/07/2020 at 5:35 pm to provider Dr. Arora via telephone, who verbally acknowledged these results. Electronically Signed   By: Frederick S Jones MD   On: 01/07/2020 17:39    Time Spent in minutes  30   Afsheen Antony M.D on 01/09/2020 at 11:03 AM  To page go to [email or web address] - password [email or web address]     

## 2020-01-09 NOTE — Transfer of Care (Signed)
Immediate Anesthesia Transfer of Care Note  Patient: Shannon Nixon  Procedure(s) Performed: TRANSESOPHAGEAL ECHOCARDIOGRAM (TEE) (N/A ) BUBBLE STUDY  Patient Location: Endoscopy Unit  Anesthesia Type:MAC  Level of Consciousness: awake and alert   Airway & Oxygen Therapy: Patient Spontanous Breathing and Patient connected to nasal cannula oxygen  Post-op Assessment: Report given to RN and Post -op Vital signs reviewed and stable  Post vital signs: Reviewed and stable  Last Vitals:  Vitals Value Taken Time  BP 124/53 01/09/20 1409  Temp 36.9 C 01/09/20 1409  Pulse 73 01/09/20 1411  Resp 17 01/09/20 1411  SpO2 99 % 01/09/20 1411  Vitals shown include unvalidated device data.  Last Pain:  Vitals:   01/09/20 1409  TempSrc: Axillary  PainSc: 0-No pain         Complications: No complications documented.

## 2020-01-09 NOTE — Progress Notes (Signed)
Physical Therapy Treatment Patient Details Name: Shannon Nixon MRN: [id number] DOB: 14-Feb-1934 Today's Date: 01/09/2020    History of Present Illness 86 y.o. female with medical history significant of prior strokes most recently in 2020 at [facility], hypertension, OA, hyperlipidemia, CKD, adjustment disorder with depressed mood, and adjustment insomnia presented following onset of aphasia at home. MRI brain showed acute high left frontal cortical infarct with additional punctate infarcts in various lobes suggestive of embolic etiology.     PT Comments    Pt making good progress today, following basic commands 75% of time and attempting to verbalize with some success. Pt ambulated 150' with RW and min A with 1 standing rest break. She also performed standing balance activities at sink for support. Given that pt has 24 hr assist at home, changing d/c rec to HHPT. PT will continue to follow.    Follow Up Recommendations  Home health PT;Supervision/Assistance - 24 hour     Equipment Recommendations  Rolling walker with 5" wheels;3in1 (PT) (need to confirm DME the pt owns with family)    Recommendations for Other Services       Precautions / Restrictions Precautions Precautions: Fall Restrictions Weight Bearing Restrictions: No    Mobility  Bed Mobility Overal bed mobility: Needs Assistance Bed Mobility: Sit to Supine;Supine to Sit     Supine to sit: Supervision Sit to supine: Supervision   General bed mobility comments: pt able to get self in and out of bed and position self for comfort  Transfers Overall transfer level: Needs assistance Equipment used: Rolling walker (2 wheeled) Transfers: Sit to/from Stand Sit to Stand: Min assist         General transfer comment: steadying assist throughout  Ambulation/Gait Ambulation/Gait assistance: Min assist Gait Distance (Feet): 150 Feet Assistive device: Rolling walker (2 wheeled) Gait Pattern/deviations: Trunk  flexed;Step-through pattern Gait velocity: reduced Gait velocity interpretation: <1.8 ft/sec, indicate of risk for recurrent falls General Gait Details: vc's for erect posture though pt maintains slight trunk flexion. Needed tactile cues occasionally to help remain in RW. R foot externally rotated   Stairs             Wheelchair Mobility    Modified Rankin (Stroke Patients Only) Modified Rankin (Stroke Patients Only) Pre-Morbid Rankin Score: Slight disability (need to confirm with family) Modified Rankin: Moderately severe disability     Balance Overall balance assessment: Needs assistance Sitting-balance support: Single extremity supported;Feet supported Sitting balance-Leahy Scale: Fair Sitting balance - Comments: pt able to sit EOB without UE support today   Standing balance support: Single extremity supported;During functional activity Standing balance-Leahy Scale: Poor Standing balance comment: external assist for balance                High Level Balance Comments: worked on standing balance activities at sink x8 mins            Cognition Arousal/Alertness: Awake/alert Behavior During Therapy: Impulsive Overall Cognitive Status: Difficult to assess                                 General Comments: pt following simple commands today, occasionally commands needed repeating. Followed directional cues in hallway      Exercises General Exercises - Lower Extremity Hip Flexion/Marching: AROM;Both;10 reps;Seated    General Comments General comments (skin integrity, edema, etc.): per chart, pt does not live alone but lives with family and has 24 hr assist. Changing d/c  rec to HHPT in light of this info and improvement today      Pertinent Vitals/Pain Pain Assessment: Faces Faces Pain Scale: No hurt    Home Living                      Prior Function            PT Goals (current goals can now be found in the care plan section)  Acute Rehab PT Goals Patient Stated Goal: go home PT Goal Formulation: With patient Time For Goal Achievement: 01/22/20 Potential to Achieve Goals: Good Progress towards PT goals: Progressing toward goals    Frequency    Min 4X/week      PT Plan Discharge plan needs to be updated    Co-evaluation              AM-PAC PT "6 Clicks" Mobility   Outcome Measure  Help needed turning from your back to your side while in a flat bed without using bedrails?: None Help needed moving from lying on your back to sitting on the side of a flat bed without using bedrails?: None Help needed moving to and from a bed to a chair (including a wheelchair)?: A Little Help needed standing up from a chair using your arms (e.g., wheelchair or bedside chair)?: A Little Help needed to walk in hospital room?: A Little Help needed climbing 3-5 steps with a railing? : A Lot 6 Click Score: 19    End of Session Equipment Utilized During Treatment: Gait belt Activity Tolerance: Patient tolerated treatment well Patient left: in bed;with call bell/phone within reach;with bed alarm set Nurse Communication: Mobility status PT Visit Diagnosis: Unsteadiness on feet (R26.81);Other abnormalities of gait and mobility (R26.89);Muscle weakness (generalized) (M62.81);Other symptoms and signs involving the nervous system (R29.898)     Time: 6759-1638 PT Time Calculation (min) (ACUTE ONLY): 24 min  Charges:  $Gait Training: 8-22 mins $Therapeutic Activity: 8-22 mins                     Lyanne Co, PT  Acute Rehab Services  Pager [phone] Office [phone]    Lawana Chambers Jackson Coffield 01/09/2020, 10:56 AM

## 2020-01-09 NOTE — Progress Notes (Signed)
STROKE TEAM PROGRESS NOTE   INTERVAL HISTORY Patient is sitting up in bed with her daughter.  She continues to have expressive aphasia with word finding difficulties and nonfluent speech but has good understanding.  TEE is pending for later today.  Vital signs are stable.  OBJECTIVE Vitals:   01/08/20 2022 01/08/20 2350 01/09/20 0402 01/09/20 0829  BP: 109/60 (!) 115/57 137/85 (!) 158/81  Pulse: 74 69 79 79  Resp: 20 18 18 18   Temp: 98.8 F (37.1 C) 97.9 F (36.6 C) 97.8 F (36.6 C) (!) 97.5 F (36.4 C)  TempSrc: Oral Oral Oral Oral  SpO2: 95% 93% 94% 96%  Weight:       CBC:  Recent Labs  Lab 01/07/20 1723 01/09/20 0418  WBC 10.3 7.0  NEUTROABS 6.2 3.6  HGB 12.8 12.0  HCT 40.3 35.9*  MCV 99.8 96.8  PLT 181 195   Basic Metabolic Panel:  Recent Labs  Lab 01/07/20 1723 01/09/20 0418  NA 138 138  K 4.3 4.4  CL 104 105  CO2 21* 22  GLUCOSE 107* 98  BUN 24* 23  CREATININE 1.44* 1.31*  CALCIUM 9.7 9.4  MG  --  1.8   Lipid Panel:     Component Value Date/Time   CHOL 136 01/08/2020 0231   TRIG 89 01/08/2020 0231   HDL 44 01/08/2020 0231   CHOLHDL 3.1 01/08/2020 0231   VLDL 18 01/08/2020 0231   LDLCALC 74 01/08/2020 0231   HgbA1c:  Lab Results  Component Value Date   HGBA1C 5.3 01/08/2020    IMAGING ECHOCARDIOGRAM COMPLETE  Result Date: 01/08/2020    ECHOCARDIOGRAM REPORT   Patient Name:   Shannon Nixon Date of Exam: 01/08/2020 Medical Rec #:  01/10/2020         Height:       65.0 in Accession #:    [id number]        Weight:       164.4 lb Date of Birth:  [birth year]         BSA:          1.820 m Patient Age:    86 years          BP:           121/74 mmHg Patient Gender: F                 HR:           86 bpm. Exam Location:  Inpatient Procedure: 2D Echo, Cardiac Doppler and Color Doppler Indications:    CVA  History:        Patient has no prior history of Echocardiogram examinations.                 Stroke, Signs/Symptoms:Altered Mental Status; Risk                  Factors:Hypertension and Dyslipidemia.  Sonographer:    05/11/1933 Referring Phys: Lavenia Atlas [id number] MELVIN IMPRESSIONS  1. Left ventricular ejection fraction, by estimation, is 60 to 65%. The left ventricle has normal function. The left ventricle has no regional wall motion abnormalities. There is mild left ventricular hypertrophy. Left ventricular diastolic parameters are consistent with Grade I diastolic dysfunction (impaired relaxation).  2. Right ventricular systolic function is normal. The right ventricular size is normal. There is normal pulmonary artery systolic pressure. The estimated right ventricular systolic pressure is 34.8 mmHg.  3. The mitral valve is grossly normal. Mild mitral valve  regurgitation. No evidence of mitral stenosis.  4. The aortic valve is tricuspid. There is mild calcification of the aortic valve. There is mild thickening of the aortic valve. Aortic valve regurgitation is not visualized. Mild aortic valve sclerosis is present, with no evidence of aortic valve stenosis.  5. The inferior vena cava is normal in size with greater than 50% respiratory variability, suggesting right atrial pressure of 3 mmHg. FINDINGS  Left Ventricle: Left ventricular ejection fraction, by estimation, is 60 to 65%. The left ventricle has normal function. The left ventricle has no regional wall motion abnormalities. The left ventricular internal cavity size was normal in size. There is  mild left ventricular hypertrophy. Left ventricular diastolic parameters are consistent with Grade I diastolic dysfunction (impaired relaxation). Normal left ventricular filling pressure. Right Ventricle: The right ventricular size is normal. No increase in right ventricular wall thickness. Right ventricular systolic function is normal. There is normal pulmonary artery systolic pressure. The tricuspid regurgitant velocity is 2.82 m/s, and  with an assumed right atrial pressure of 3 mmHg, the estimated right  ventricular systolic pressure is 34.8 mmHg. Left Atrium: Left atrial size was normal in size. Right Atrium: Right atrial size was normal in size. Pericardium: Trivial pericardial effusion is present. Mitral Valve: The mitral valve is grossly normal. There is mild calcification of the mitral valve leaflet(s). Mild mitral valve regurgitation. No evidence of mitral valve stenosis. Tricuspid Valve: The tricuspid valve is grossly normal. Tricuspid valve regurgitation is trivial. No evidence of tricuspid stenosis. Aortic Valve: The aortic valve is tricuspid. There is mild calcification of the aortic valve. There is mild thickening of the aortic valve. Aortic valve regurgitation is not visualized. Mild aortic valve sclerosis is present, with no evidence of aortic valve stenosis. Pulmonic Valve: The pulmonic valve was grossly normal. Pulmonic valve regurgitation is not visualized. No evidence of pulmonic stenosis. Aorta: The aortic root is normal in size and structure. Venous: The inferior vena cava is normal in size with greater than 50% respiratory variability, suggesting right atrial pressure of 3 mmHg. IAS/Shunts: The atrial septum is grossly normal.  LEFT VENTRICLE PLAX 2D LVIDd:         3.60 cm  Diastology LVIDs:         2.60 cm  LV e' medial:    6.20 cm/s LV PW:         1.30 cm  LV E/e' medial:  8.7 LV IVS:        1.30 cm  LV e' lateral:   6.64 cm/s LVOT diam:     1.90 cm  LV E/e' lateral: 8.1 LV SV:         46 LV SV Index:   25 LVOT Area:     2.84 cm  RIGHT VENTRICLE RV Basal diam:  2.80 cm RV S prime:     10.00 cm/s TAPSE (M-mode): 2.7 cm LEFT ATRIUM             Index       RIGHT ATRIUM           Index LA diam:        3.30 cm 1.81 cm/m  RA Area:     13.50 cm LA Vol (A2C):   17.9 ml 9.84 ml/m  RA Volume:   34.60 ml  19.01 ml/m LA Vol (A4C):   26.8 ml 14.73 ml/m LA Biplane Vol: 22.5 ml 12.36 ml/m  AORTIC VALVE LVOT Vmax:   79.00 cm/s LVOT Vmean:  52.300 cm/s  LVOT VTI:    0.163 m  AORTA Ao Root diam: 2.90 cm  MITRAL VALVE               TRICUSPID VALVE MV Area (PHT): 3.99 cm    TR Peak grad:   31.8 mmHg MV Decel Time: 190 msec    TR Vmax:        282.00 cm/s MV E velocity: 54.00 cm/s MV A velocity: 79.30 cm/s  SHUNTS MV E/A ratio:  0.68        Systemic VTI:  0.16 m                            Systemic Diam: 1.90 cm Lennie Odor MD Electronically signed by Lennie Odor MD Signature Date/Time: 01/08/2020/3:34:36 PM    Final     PHYSICAL EXAM   Blood pressure (!) 158/81, pulse 79, temperature (!) 97.5 F (36.4 C), temperature source Oral, resp. rate 18, weight 74.6 kg, SpO2 96 %.  Neurologic Exam:  Patient appears   alert, expressive aphasia, nonfluent speech with significant word hesitancy but able to follow simple commands easily however, blinks to threat bilaterally, extraocular movements intact bilaterally, pupils equally round and reactive to light, smile symmetric, states sensation intact normally, hearing intact to voice, midline tongue , patient is able to raise all 4 extremities antigravity without drift no focal deficit appreciated, normal tone and bulk throughout, no atrophy or abnormal movements, no ataxia or dysmetria.   ASSESSMENT/PLAN Ms. Shannon Nixon is a 86 y.o. female with history of HLD, HTN, OA prior strokes-last one in 2020 seen at [facility], found to have scattered embolic strokes in bilateral cerebellar and cerebral hemispheres, who presented to [facility] ED as a code stroke for slurred speech and aphasia.  She did not receive IV t-PA due to mild deficits.  Stroke: Biateral infarcts - embolic - source unknown, suspect AF  Resultant expressive aphasia  Code Stroke CT Head - Slight progression of hypoattenuation in the high right frontoparietal region.  No acute hemorrhage.    MRI head - Acute high left frontal cortical infarct with additional punctate infarcts involving the right parietal lobe, right frontal lobe, and posterior left temporal lobe. Remote infarcts and chronic  microvascular ischemic disease  MRA head - No LVO or significant stenosis. Similar appearance from 09/25/2018.   Carotid Doppler - L ICA 40-59% stenosis   LE Doppler - neg DVT  2D Echo - EF 60-65%. No source of embolus   TEE EF 50-55%, no SOE, neg bubble  loop recorder placement 01/09/20 Shannon Nixon)  Loyal Jacobson Virus 2 - negative  LDL - 74  HgbA1c - 5.3  VTE prophylaxis - Lovenox  aspirin 325 mg daily prior to admission, now on aspirin 81 mg daily  Therapy recommendations:  HH PT, CIR, SLP  Disposition:  Pending  Hypertension  Home BP meds: Cozaar  Current BP meds: none   Stable . Permissive hypertension (OK if < 220/120) but gradually normalize in 5-7 days  . Long-term BP goal normotensive  Hyperlipidemia  Home Lipid lowering medication:  Lipitor 40 mg daily   LDL 74, goal < 70  Current lipid lowering medication: Lipitor 40 mg daily   Continue statin at discharge  Other Stroke Risk Factors  Advanced age  Former cigarette smoker - quit  Previous ETOH use  Overweight, Body mass index is 27.36 kg/m., recommend weight loss, diet and exercise as appropriate   Hx stroke/TIA  Other Active Problems CKD - stage 3b - creatinine - 1.44->1.31  Hospital day # 2 Patient is scheduled for TEE today and if unyielding may need prolonged cardiac monitoring for paroxysmal A. fib.  Strong suspicion for A. fib.  Recommend aspirin Plavix for 3 weeks followed by Plavix alone.  Long discussion with patient, daughter and Dr. Thedore MinsSingh and answered questions.  Greater than 50% time during this 25-minute visit was spent on counseling and coordination of care about her embolic stroke and discussion with evaluation treatment plan and answering questions. Delia HeadyPramod Kalaya Infantino, MD To contact Stroke Continuity provider, please refer to [email or web address]. After hours, contact General Neurology

## 2020-01-10 ENCOUNTER — Encounter (HOSPITAL_COMMUNITY): Payer: Self-pay | Admitting: Internal Medicine

## 2020-01-10 DIAGNOSIS — E7800 Pure hypercholesterolemia, unspecified: Secondary | ICD-10-CM

## 2020-01-10 DIAGNOSIS — N1832 Chronic kidney disease, stage 3b: Secondary | ICD-10-CM

## 2020-01-10 LAB — PROCALCITONIN: Procalcitonin: <0.1 ng/mL

## 2020-01-10 LAB — COMPREHENSIVE METABOLIC PANEL
ALT: 14 U/L (ref 0–44)
AST: 27 U/L (ref 15–41)
Albumin: 3.7 g/dL (ref 3.5–5.0)
Alkaline Phosphatase: 76 U/L (ref 38–126)
Anion gap: 11 (ref 5–15)
BUN: 17 mg/dL (ref 8–23)
CO2: 21 mmol/L — ABNORMAL LOW (ref 22–32)
Calcium: 9.1 mg/dL (ref 8.9–10.3)
Chloride: 105 mmol/L (ref 98–111)
Creatinine, Ser: 1.23 mg/dL — ABNORMAL HIGH (ref 0.44–1.00)
GFR, Estimated: 43 mL/min — ABNORMAL LOW (ref >60)
Glucose, Bld: 92 mg/dL (ref 70–99)
Potassium: 3.9 mmol/L (ref 3.5–5.1)
Sodium: 137 mmol/L (ref 135–145)
Total Bilirubin: 1.2 mg/dL (ref 0.3–1.2)
Total Protein: 6.4 g/dL — ABNORMAL LOW (ref 6.5–8.1)

## 2020-01-10 LAB — CBC WITH DIFFERENTIAL/PLATELET
Abs Immature Granulocytes: 0.06 10*3/uL (ref 0.00–0.07)
Basophils Absolute: 0 10*3/uL (ref 0.0–0.1)
Basophils Relative: 0 %
Eosinophils Absolute: 0.2 10*3/uL (ref 0.0–0.5)
Eosinophils Relative: 2 %
HCT: 34.1 % — ABNORMAL LOW (ref 36.0–46.0)
Hemoglobin: 11.3 g/dL — ABNORMAL LOW (ref 12.0–15.0)
Immature Granulocytes: 1 %
Lymphocytes Relative: 27 %
Lymphs Abs: 1.9 10*3/uL (ref 0.7–4.0)
MCH: 31.4 pg (ref 26.0–34.0)
MCHC: 33.1 g/dL (ref 30.0–36.0)
MCV: 94.7 fL (ref 80.0–100.0)
Monocytes Absolute: 1.1 10*3/uL — ABNORMAL HIGH (ref 0.1–1.0)
Monocytes Relative: 15 %
Neutro Abs: 3.9 10*3/uL (ref 1.7–7.7)
Neutrophils Relative %: 55 %
Platelets: 187 10*3/uL (ref 150–400)
RBC: 3.6 MIL/uL — ABNORMAL LOW (ref 3.87–5.11)
RDW: 13.6 % (ref 11.5–15.5)
WBC: 7 10*3/uL (ref 4.0–10.5)
nRBC: 0 % (ref 0.0–0.2)

## 2020-01-10 LAB — MAGNESIUM: Magnesium: 1.7 mg/dL (ref 1.7–2.4)

## 2020-01-10 LAB — BRAIN NATRIURETIC PEPTIDE: B Natriuretic Peptide: 66.6 pg/mL (ref 0.0–100.0)

## 2020-01-10 MED ORDER — CLOPIDOGREL BISULFATE 75 MG PO TABS
75.0000 mg | ORAL_TABLET | Freq: Every day | ORAL | 11 refills | Status: DC
Start: 2020-01-11 — End: 2023-10-22

## 2020-01-10 MED ORDER — ASPIRIN 81 MG PO TBEC: 81.0000 mg | DELAYED_RELEASE_TABLET | Freq: Every day | ORAL | 0 refills | Status: AC

## 2020-01-10 NOTE — Discharge Summary (Signed)
Physician Discharge Summary  Shannon Nixon BJY:[id number] DOB: [birth year] DOA: 01/07/2020  PCP: Shannon MedinFulbright, [place] E, PA-C  Admit date: 01/07/2020 Discharge date: 01/10/2020  Admitted From: Home Disposition: Home  Recommendations for Outpatient Follow-up:  1. Follow up with PCP in 1-2 weeks 2. Neurology as scheduled  Home Health: PT Equipment/Devices: None  Discharge Condition: Stable CODE STATUS: Full Diet recommendation: Low-salt low-fat diet  Brief/Interim Summary: Shannon PitchMargaret Willfordis a 86 y.o.femalewith medical history significant of[facility], hypertension, OA, hyperlipidemia, CKD, adjustment disorder with depressed mood, and adjustment insomnia presented following onset of aphasia at home, upon arrival to the ER her NIH stroke scale was 3, she was seen by neurology and underwent MRI showing bihemispheric CVA suspicious for cardioembolic source, she was admitted for further treatment.  Patient admitted for expressive aphasia due to acute embolic CVA involving multiple bilateral brain distribution, in a patient with history of previous CVA. Neurology on board. Allow permissive hypertension, currently on aspirin 81 mg and statin 40 mg per cardiology. Full stroke work-up underway. Defer further management to cardiology team. LDL is near goal at 78 and will addedniacin to statin. No documented history of A. Fib and recent [facility] did not reveal A. fib either.Per neurology she will undergo leg venous ultrasound, carotid ultrasound,TEE and loop recorderplacement this admission. EP following.  TEE negative for acute thrombus or findings, patient ultimately evaluated with loop placement on 01/09/2020, now otherwise stable and agreeable for discharge home with outpatient follow-up with PCP, neurology and cardiology.  Discharge Diagnoses:  Active Problems:   Acute CVA (cerebrovascular accident) (HCC)    History of embolic stroke   Benign essential hypertension   Stage 3b chronic kidney disease (HCC)   Pure hypercholesterolemia   Primary osteoarthritis of right hip    Discharge Instructions  Discharge Instructions    Diet - low sodium heart healthy   Complete by: As directed    Increase activity slowly   Complete by: As directed      Allergies as of 01/10/2020   No Known Allergies     Medication List    STOP taking these medications   aspirin 325 MG tablet Replaced by: aspirin 81 MG EC tablet     TAKE these medications   aspirin 81 MG EC tablet Take 1 tablet (81 mg total) by mouth daily for 21 days. Swallow whole. Start taking on: January 11, 2020 Replaces: aspirin 325 MG tablet   atorvastatin 40 MG tablet Commonly known as: LIPITOR Take 40 mg by mouth daily.   citalopram 10 MG tablet Commonly known as: CELEXA Take 10 mg by mouth daily.   clopidogrel 75 MG tablet Commonly known as: PLAVIX Take 1 tablet (75 mg total) by mouth daily. Start taking on: January 11, 2020   COLLAGEN PO Take 1 capsule by mouth in the morning and at bedtime.   gabapentin 100 MG capsule Commonly known as: NEURONTIN Take 100 mg by mouth at bedtime.   losartan 25 MG tablet Commonly known as: COZAAR Take 25 mg by mouth daily.   mirtazapine 15 MG tablet Commonly known as: REMERON Take 15 mg by mouth at bedtime.   multivitamin with minerals tablet Take 1 tablet by mouth daily.   omeprazole 40 MG capsule Commonly known as: PRILOSEC Take 40 mg by mouth daily.   traZODone 100 MG tablet Commonly known as: DESYREL Take 100 mg by mouth at bedtime.  Durable Medical Equipment  (From admission, onward)         Start     Ordered   01/09/20 1616  For home use only DME 3 n 1  Once        01/09/20 1615   01/09/20 1616  For home use only DME Walker rolling  Once       Question Answer Comment  Walker: With 5 Inch Wheels   Patient needs a walker to treat with the  following condition Stroke [facility])      01/09/20 1615          Follow-up Information    [facility] Follow up.   Specialty: Cardiology Why: 01/19/2020 @ 2:00PM, wound check visit Contact information: [address] [place] [address] [phone]             No Known Allergies  Consultations:  Neurology, cardiology   Procedures/Studies: MR ANGIO HEAD WO CONTRAST  Result Date: 01/07/2020 CLINICAL DATA:  Stroke follow-up. EXAM: MRA HEAD WITHOUT CONTRAST TECHNIQUE: Angiographic images of the Circle of Willis were obtained using MRA technique without intravenous contrast. COMPARISON:  MRI from the same day and MRA from 09/25/2018 FINDINGS: Similar appearance of the anterior and posterior circulation comparison to prior MRA from 09/25/2018. No evidence of hemodynamically significant stenosis or large vessel occlusion involving the anterior posterior circulation. Mild irregularity of distal vessels likely relates to atherosclerosis. No evidence of aneurysm or vascular malformation. IMPRESSION: No evidence of proximal hemodynamically significant stenosis or large vessel occlusion.Similar appearance of the intracranial vessels in comparison to prior MRA from 09/25/2018. Electronically Signed   By: Feliberto Harts MD   On: 01/07/2020 20:30   MR BRAIN WO CONTRAST  Result Date: 01/07/2020 CLINICAL DATA:  Neuro deficit, acute stroke suspected. EXAM: MRI HEAD WITHOUT CONTRAST TECHNIQUE: Multiplanar, multiecho pulse sequences of the brain and surrounding structures were obtained without intravenous contrast. COMPARISON:  MRI August 26, 2018 FINDINGS: Brain: There is an acute infarct involving the high left frontal cortex. Additional punctate infarcts involving the posterior right parietal lobe (series 5, image 84), the posterior left temporal lobe (see series 5, images 68 and 69), and right frontal cortex (series 5, image 86). There is mild edema  associated with the left frontal infarct without substantial mass effect. No midline shift. No evidence of acute hemorrhage. Encephalomalacia associated with prior right frontoparietal infarct. Additional scattered T2/FLAIR hyperintensities are compatible with the sequela of prior microvascular ischemic disease. Small remote lacunar infarcts in bilateral cerebellar hemispheres. No mass lesion or abnormal mass effect. No hydrocephalus. Vascular: Proximal major arterial flow voids are maintained at the skull base. Skull and upper cervical spine: Normal marrow signal. Sinuses/Orbits: Scattered mucosal thickening without air-fluid levels in the sinuses. Negative orbits. Other: Small right greater than left mastoid effusions. IMPRESSION: 1. Acute high left frontal cortical infarct with additional punctate infarcts involving the right parietal lobe, right frontal lobe, and posterior left temporal lobe, as detailed above. Involvement of multiple vascular territories suggests a central embolic etiology. 2. Remote infarcts and chronic microvascular ischemic disease, as described above. Findings discussed with Dr. Wilford Corner via telephone at 6:21 p.m. Electronically Signed   By: Feliberto Harts MD   On: 01/07/2020 18:23   EP PPM/ICD IMPLANT  Result Date: 01/09/2020 CONCLUSIONS:  1. Successful implantation of a [employer] Reveal LINQ implantable loop recorder for cryptogenic stroke  2. No early apparent complications. [name], MD 01/09/2020 2:35 PM   ECHOCARDIOGRAM COMPLETE  Result Date: 01/08/2020    ECHOCARDIOGRAM REPORT   Patient Name:   Shannon Nixon Date of Exam: 01/08/2020 Medical Rec #:  [id number]         Height:       65.0 in Accession #:    [id number]        Weight:       164.4 lb Date of Birth:  21-Jan-1934         BSA:          1.820 m Patient Age:    86 years          BP:           121/74 mmHg Patient Gender: F                 HR:           86 bpm. Exam Location:  Inpatient Procedure: 2D Echo, Cardiac  Doppler and Color Doppler Indications:    CVA  History:        Patient has no prior history of Echocardiogram examinations.                 Stroke, Signs/Symptoms:Altered Mental Status; Risk                 Factors:Hypertension and Dyslipidemia.  Sonographer:    Lavenia Atlas Referring Phys: [id number] Cecille Po MELVIN IMPRESSIONS  1. Left ventricular ejection fraction, by estimation, is 60 to 65%. The left ventricle has normal function. The left ventricle has no regional wall motion abnormalities. There is mild left ventricular hypertrophy. Left ventricular diastolic parameters are consistent with Grade I diastolic dysfunction (impaired relaxation).  2. Right ventricular systolic function is normal. The right ventricular size is normal. There is normal pulmonary artery systolic pressure. The estimated right ventricular systolic pressure is 34.8 mmHg.  3. The mitral valve is grossly normal. Mild mitral valve regurgitation. No evidence of mitral stenosis.  4. The aortic valve is tricuspid. There is mild calcification of the aortic valve. There is mild thickening of the aortic valve. Aortic valve regurgitation is not visualized. Mild aortic valve sclerosis is present, with no evidence of aortic valve stenosis.  5. The inferior vena cava is normal in size with greater than 50% respiratory variability, suggesting right atrial pressure of 3 mmHg. FINDINGS  Left Ventricle: Left ventricular ejection fraction, by estimation, is 60 to 65%. The left ventricle has normal function. The left ventricle has no regional wall motion abnormalities. The left ventricular internal cavity size was normal in size. There is  mild left ventricular hypertrophy. Left ventricular diastolic parameters are consistent with Grade I diastolic dysfunction (impaired relaxation). Normal left ventricular filling pressure. Right Ventricle: The right ventricular size is normal. No increase in right ventricular wall thickness. Right ventricular  systolic function is normal. There is normal pulmonary artery systolic pressure. The tricuspid regurgitant velocity is 2.82 m/s, and  with an assumed right atrial pressure of 3 mmHg, the estimated right ventricular systolic pressure is 34.8 mmHg. Left Atrium: Left atrial size was normal in size. Right Atrium: Right atrial size was normal in size. Pericardium: Trivial pericardial effusion is present. Mitral Valve: The mitral valve is grossly normal. There is mild calcification of the mitral valve leaflet(s). Mild mitral valve regurgitation. No evidence of mitral valve stenosis. Tricuspid Valve: The tricuspid valve is grossly normal. Tricuspid valve regurgitation is trivial. No evidence of tricuspid stenosis. Aortic Valve: The aortic valve is tricuspid. There is mild calcification of the aortic  valve. There is mild thickening of the aortic valve. Aortic valve regurgitation is not visualized. Mild aortic valve sclerosis is present, with no evidence of aortic valve stenosis. Pulmonic Valve: The pulmonic valve was grossly normal. Pulmonic valve regurgitation is not visualized. No evidence of pulmonic stenosis. Aorta: The aortic root is normal in size and structure. Venous: The inferior vena cava is normal in size with greater than 50% respiratory variability, suggesting right atrial pressure of 3 mmHg. IAS/Shunts: The atrial septum is grossly normal.  LEFT VENTRICLE PLAX 2D LVIDd:         3.60 cm  Diastology LVIDs:         2.60 cm  LV Nixon' medial:    6.20 cm/s LV PW:         1.30 cm  LV Nixon/Nixon' medial:  8.7 LV IVS:        1.30 cm  LV Nixon' lateral:   6.64 cm/s LVOT diam:     1.90 cm  LV Nixon/Nixon' lateral: 8.1 LV SV:         46 LV SV Index:   25 LVOT Area:     2.84 cm  RIGHT VENTRICLE RV Basal diam:  2.80 cm RV S prime:     10.00 cm/s TAPSE (M-mode): 2.7 cm LEFT ATRIUM             Index       RIGHT ATRIUM           Index LA diam:        3.30 cm 1.81 cm/m  RA Area:     13.50 cm LA Vol (A2C):   17.9 ml 9.84 ml/m  RA Volume:   34.60  ml  19.01 ml/m LA Vol (A4C):   26.8 ml 14.73 ml/m LA Biplane Vol: 22.5 ml 12.36 ml/m  AORTIC VALVE LVOT Vmax:   79.00 cm/s LVOT Vmean:  52.300 cm/s LVOT VTI:    0.163 m  AORTA Ao Root diam: 2.90 cm MITRAL VALVE               TRICUSPID VALVE MV Area (PHT): 3.99 cm    TR Peak grad:   31.8 mmHg MV Decel Time: 190 msec    TR Vmax:        282.00 cm/s MV Nixon velocity: 54.00 cm/s MV A velocity: 79.30 cm/s  SHUNTS MV Nixon/A ratio:  0.68        Systemic VTI:  0.16 m                            Systemic Diam: 1.90 cm Lennie Odor MD Electronically signed by Lennie Odor MD Signature Date/Time: 01/08/2020/3:34:36 PM    Final    ECHO TEE  Result Date: 01/09/2020    TRANSESOPHOGEAL ECHO REPORT   Patient Name:   Shannon Nixon Date of Exam: 01/09/2020 Medical Rec #:  [id number]         Height:       65.0 in Accession #:    [id number]        Weight:       164.4 lb Date of Birth:  Jun 10, 1933         BSA:          1.820 m Patient Age:    86 years          BP:           160/80 mmHg Patient Gender: F  HR:           90 bpm. Exam Location:  Inpatient Procedure: Transesophageal Echo Indications:    Stroke  History:        Patient has prior history of Echocardiogram examinations.  Sonographer:    Thurman Coyer RDCS (AE) Referring Phys: [id number] DAVID L RINEHULS PROCEDURE: After discussion of the risks and benefits of a TEE, an informed consent was obtained. The transesophogeal probe was passed without difficulty through the esophogus of the patient. Local oropharyngeal anesthetic was provided with Benzocaine spray. Sedation performed by different physician. Image quality was excellent. The patient's vital signs; including heart rate, blood pressure, and oxygen saturation; remained stable throughout the procedure. The patient developed no complications during  the procedure. IMPRESSIONS  1. No SOE patient will proceed with ILR implant with Dr Ladona Ridgel.  2. Septal hypokinesis . Left ventricular ejection fraction, by  estimation, is 50 to 55%. The left ventricle has low normal function. The left ventricle demonstrates regional wall motion abnormalities (see scoring diagram/findings for description). There  is mild asymmetric left ventricular hypertrophy of the basal and septal segments.  3. Right ventricular systolic function is normal. The right ventricular size is normal.  4. No left atrial/left atrial appendage thrombus was detected.  5. The mitral valve is normal in structure. Mild mitral valve regurgitation. No evidence of mitral stenosis.  6. The aortic valve is normal in structure. Aortic valve regurgitation is not visualized. No aortic stenosis is present.  7. The inferior vena cava is normal in size with greater than 50% respiratory variability, suggesting right atrial pressure of 3 mmHg.  8. Agitated saline contrast bubble study was negative, with no evidence of any interatrial shunt. Conclusion(s)/Recommendation(s): Normal biventricular function without evidence of hemodynamically significant valvular heart disease. FINDINGS  Left Ventricle: Septal hypokinesis. Left ventricular ejection fraction, by estimation, is 50 to 55%. The left ventricle has low normal function. The left ventricle demonstrates regional wall motion abnormalities. The left ventricular internal cavity size was normal in size. There is mild asymmetric left ventricular hypertrophy of the basal and septal segments. Right Ventricle: The right ventricular size is normal. No increase in right ventricular wall thickness. Right ventricular systolic function is normal. Left Atrium: Left atrial size was normal in size. No left atrial/left atrial appendage thrombus was detected. Right Atrium: Right atrial size was normal in size. Pericardium: There is no evidence of pericardial effusion. Mitral Valve: The mitral valve is normal in structure. Mild mitral valve regurgitation. No evidence of mitral valve stenosis. Tricuspid Valve: The tricuspid valve is normal in  structure. Tricuspid valve regurgitation is mild . No evidence of tricuspid stenosis. Aortic Valve: The aortic valve is normal in structure. Aortic valve regurgitation is not visualized. No aortic stenosis is present. Pulmonic Valve: The pulmonic valve was normal in structure. Pulmonic valve regurgitation is trivial. No evidence of pulmonic stenosis. Aorta: The aortic root is normal in size and structure. Venous: The inferior vena cava is normal in size with greater than 50% respiratory variability, suggesting right atrial pressure of 3 mmHg. IAS/Shunts: No atrial level shunt detected by color flow Doppler. Agitated saline contrast was given intravenously to evaluate for intracardiac shunting. Agitated saline contrast bubble study was negative, with no evidence of any interatrial shunt. Additional Comments: No SOE patient will proceed with ILR implant with Dr Ladona Ridgel. Charlton Haws MD Electronically signed by Charlton Haws MD Signature Date/Time: 01/09/2020/2:01:59 PM    Final    CT HEAD CODE STROKE WO CONTRAST  Result Date: 01/07/2020 CLINICAL DATA:  Code stroke. Neuro deficit, acute stroke suspected. Slurred speech and dysphagia. EXAM: CT HEAD WITHOUT CONTRAST TECHNIQUE: Contiguous axial images were obtained from the base of the skull through the vertex without intravenous contrast. COMPARISON:  MRI and CT from a 08/26/2018 FINDINGS: Brain: Slight progression of hypoattenuation in the high right frontoparietal region, in the region of infarcts seen on prior MRI. There is suggestion of volume loss in this region additional patchy areas of white matter attenuation are similar to prior and likely represent the sequela of chronic microvascular ischemic disease or prior small infarcts. No acute hemorrhage. Vascular: No hyperdense vessel or unexpected calcification. Skull: No acute fracture. Sinuses/Orbits: Opacification a left posterior ethmoid air cell. Scattered mucosal thickening. Unremarkable orbits. Other: No  mastoid effusions. IMPRESSION: 1. Slight progression of hypoattenuation in the high right frontoparietal region. This is favored to represent evolution of infarcts seen in this region on the prior MRI given suggestion of associated volume loss; however, superimposed new/acute infarct is difficult to exclude. An MRI could further evaluate if clinically indicated. 2. No acute hemorrhage. Code stroke imaging results were communicated on 01/07/2020 at 5:35 pm to provider Dr. Wilford Corner via telephone, who verbally acknowledged these results. Electronically Signed   By: Feliberto Harts MD   On: 01/07/2020 17:39   VAS US CAROTID (at [facility] and [facility] only)  Result Date: 01/09/2020 Carotid Arterial Duplex Study Indications:       CVA and Speech disturbance. Risk Factors:      Hypertension, hyperlipidemia. Other Factors:     History of prior strokes, most recently 2020 at [facility]. Comparison Study:  No prior. Performing Technologist: Marilynne Halsted RDMS, RVT  Examination Guidelines: A complete evaluation includes B-mode imaging, spectral Doppler, color Doppler, and power Doppler as needed of all accessible portions of each vessel. Bilateral testing is considered an integral part of a complete examination. Limited examinations for reoccurring indications may be performed as noted.  Right Carotid Findings: +----------+--------+--------+--------+------------------+------------------+           PSV cm/sEDV cm/sStenosisPlaque DescriptionComments           +----------+--------+--------+--------+------------------+------------------+ CCA Prox  63      18                                intimal thickening +----------+--------+--------+--------+------------------+------------------+ CCA Distal80      21                                intimal thickening +----------+--------+--------+--------+------------------+------------------+ ICA Prox  58      19      1-39%   calcific                              +----------+--------+--------+--------+------------------+------------------+ ICA Distal81      27                                                   +----------+--------+--------+--------+------------------+------------------+ ECA       83      13                                                   +----------+--------+--------+--------+------------------+------------------+ +----------+--------+-------+----------------+-------------------+  PSV cm/sEDV cmsDescribe        Arm Pressure (mmHG) +----------+--------+-------+----------------+-------------------+ [id number]             Multiphasic, WNL                    +----------+--------+-------+----------------+-------------------+ +---------+--------+--+--------+--+---------+ VertebralPSV cm/s55EDV cm/s16Antegrade +---------+--------+--+--------+--+---------+  Left Carotid Findings: +----------+--------+--------+--------+------------------+------------------+           PSV cm/sEDV cm/sStenosisPlaque DescriptionComments           +----------+--------+--------+--------+------------------+------------------+ CCA Prox  158     22                                                   +----------+--------+--------+--------+------------------+------------------+ CCA Distal69      22                                intimal thickening +----------+--------+--------+--------+------------------+------------------+ ICA Prox  172     43      40-59%  calcific                             +----------+--------+--------+--------+------------------+------------------+ ICA Mid   98      30                                                   +----------+--------+--------+--------+------------------+------------------+ ICA Distal91      30                                                   +----------+--------+--------+--------+------------------+------------------+ ECA       73                                                            +----------+--------+--------+--------+------------------+------------------+ +----------+--------+--------+----------------+-------------------+           PSV cm/sEDV cm/sDescribe        Arm Pressure (mmHG) +----------+--------+--------+----------------+-------------------+ [id number]              Multiphasic, WNL                    +----------+--------+--------+----------------+-------------------+ +---------+--------+--+--------+--+---------+ VertebralPSV cm/s40EDV cm/s13Antegrade +---------+--------+--+--------+--+---------+   Summary: Right Carotid: Velocities in the right ICA are consistent with a 1-39% stenosis. Left Carotid: Velocities in the left ICA are consistent with a 40-59% stenosis. Vertebrals: Bilateral vertebral arteries demonstrate antegrade flow. *See table(s) above for measurements and observations.  Electronically signed by Delia Heady MD on 01/09/2020 at 12:56:11 PM.    Final    VAS [place] LOWER EXTREMITY VENOUS (DVT)  Result Date: 01/09/2020  Lower Venous DVTStudy Indications: Embolic strokes.  Comparison Study: No prior. Performing Technologist: Marilynne Halsted RDMS, RVT  Examination Guidelines: A complete evaluation includes B-mode imaging, spectral Doppler, color Doppler, and power Doppler as needed of all accessible portions of  each vessel. Bilateral testing is considered an integral part of a complete examination. Limited examinations for reoccurring indications may be performed as noted. The reflux portion of the exam is performed with the patient in reverse Trendelenburg.  +---------+---------------+---------+-----------+----------+--------------+ RIGHT    CompressibilityPhasicitySpontaneityPropertiesThrombus Aging +---------+---------------+---------+-----------+----------+--------------+ CFV      Full           Yes      Yes                                  +---------+---------------+---------+-----------+----------+--------------+ SFJ      Full                                                        +---------+---------------+---------+-----------+----------+--------------+ FV Prox  Full                                                        +---------+---------------+---------+-----------+----------+--------------+ FV Mid   Full                                                        +---------+---------------+---------+-----------+----------+--------------+ FV DistalFull                                                        +---------+---------------+---------+-----------+----------+--------------+ PFV      Full                                                        +---------+---------------+---------+-----------+----------+--------------+ POP      Full           Yes      Yes                                 +---------+---------------+---------+-----------+----------+--------------+ PTV      Full                                                        +---------+---------------+---------+-----------+----------+--------------+ PERO     Full                                                        +---------+---------------+---------+-----------+----------+--------------+   +---------+---------------+---------+-----------+----------+--------------+ LEFT     CompressibilityPhasicitySpontaneityPropertiesThrombus  Aging +---------+---------------+---------+-----------+----------+--------------+ CFV      Full           Yes      Yes                                 +---------+---------------+---------+-----------+----------+--------------+ SFJ      Full                                                        +---------+---------------+---------+-----------+----------+--------------+ FV Prox  Full                                                         +---------+---------------+---------+-----------+----------+--------------+ FV Mid   Full                                                        +---------+---------------+---------+-----------+----------+--------------+ FV DistalFull                                                        +---------+---------------+---------+-----------+----------+--------------+ PFV      Full                                                        +---------+---------------+---------+-----------+----------+--------------+ POP      Full           Yes      Yes                                 +---------+---------------+---------+-----------+----------+--------------+ PTV      Full                                                        +---------+---------------+---------+-----------+----------+--------------+ PERO     Full                                                        +---------+---------------+---------+-----------+----------+--------------+     Summary: BILATERAL: - No evidence of deep vein thrombosis seen in the lower extremities, bilaterally. -No evidence of popliteal cyst, bilaterally.   *See table(s) above for measurements and observations. Electronically signed by Fabienne Bruns MD on 01/09/2020 at 6:22:26 PM.    Final  Subjective: No acute issues or events overnight, tolerated procedure quite well, minimal bleeding initially but resolved overnight, pain well controlled otherwise denies nausea, vomiting, diarrhea, constipation, headache, fevers, chills.   Discharge Exam: Vitals:   01/10/20 0429 01/10/20 0832  BP: (!) 152/84 (!) 158/83  Pulse: 80 81  Resp: 18 20  Temp: 98.5 F (36.9 C) 98.2 F (36.8 C)  SpO2: 98% 92%   Vitals:   01/09/20 1959 01/10/20 0036 01/10/20 0429 01/10/20 0832  BP: 108/62 (!) 147/84 (!) 152/84 (!) 158/83  Pulse: 88 74 80 81  Resp: 16 14 18 20   Temp: 98.5 F (36.9 C) 98.6 F (37 C) 98.5 F (36.9 C) 98.2 F (36.8 C)   TempSrc: Oral Oral Oral Oral  SpO2: 94% 96% 98% 92%  Weight:      Height:        General: Pt is alert, awake, not in acute distress Cardiovascular: RRR, S1/S2 +, no rubs, no gallops.  Anterior loop recorder dressing clean dry intact. Respiratory: CTA bilaterally, no wheezing, no rhonchi Abdominal: Soft, NT, ND, bowel sounds + Extremities: no edema, no cyanosis    The results of significant diagnostics from this hospitalization (including imaging, microbiology, ancillary and laboratory) are listed below for reference.     Microbiology: Recent Results (from the past 240 hour(s))  Respiratory Panel by RT PCR (Flu A&B, Covid) - Nasopharyngeal Swab     Status: None   Collection Time: 01/07/20  5:40 PM   Specimen: Nasopharyngeal Swab  Result Value Ref Range Status   SARS Coronavirus 2 by RT PCR NEGATIVE NEGATIVE Final    Comment: (NOTE) SARS-CoV-2 target nucleic acids are NOT DETECTED.  The SARS-CoV-2 RNA is generally detectable in upper respiratoy specimens during the acute phase of infection. The lowest concentration of SARS-CoV-2 viral copies this assay can detect is 131 copies/mL. A negative result does not preclude SARS-Cov-2 infection and should not be used as the sole basis for treatment or other patient management decisions. A negative result may occur with  improper specimen collection/handling, submission of specimen other than nasopharyngeal swab, presence of viral mutation(s) within the areas targeted by this assay, and inadequate number of viral copies (<131 copies/mL). A negative result must be combined with clinical observations, patient history, and epidemiological information. The expected result is Negative.  Fact Sheet for Patients:  https://www.moore.com/  Fact Sheet for Healthcare Providers:  https://www.young.biz/  This test is no t yet approved or cleared by the [place] FDA and  has been authorized for  detection and/or diagnosis of SARS-CoV-2 by FDA under an Emergency Use Authorization (EUA). This EUA will remain  in effect (meaning this test can be used) for the duration of the COVID-19 declaration under Section 564(b)(1) of the Act, 21 U.S.C. section 360bbb-3(b)(1), unless the authorization is terminated or revoked sooner.     Influenza A by PCR NEGATIVE NEGATIVE Final   Influenza B by PCR NEGATIVE NEGATIVE Final    Comment: (NOTE) The Xpert Xpress SARS-CoV-2/FLU/RSV assay is intended as an aid in  the diagnosis of influenza from Nasopharyngeal swab specimens and  should not be used as a sole basis for treatment. Nasal washings and  aspirates are unacceptable for Xpert Xpress SARS-CoV-2/FLU/RSV  testing.  Fact Sheet for Patients: https://www.moore.com/  Fact Sheet for Healthcare Providers: https://www.young.biz/  This test is not yet approved or cleared by the [place] FDA and  has been authorized for detection and/or diagnosis of SARS-CoV-2 by  FDA under an Emergency Use Authorization (  EUA). This EUA will remain  in effect (meaning this test can be used) for the duration of the  Covid-19 declaration under Section 564(b)(1) of the Act, 21  U.S.C. section 360bbb-3(b)(1), unless the authorization is  terminated or revoked. Performed at [facility], [address], [place], [place] [address]   Culture, blood (routine x 2)     Status: None (Preliminary result)   Collection Time: 01/08/20  2:35 AM   Specimen: BLOOD  Result Value Ref Range Status   Specimen Description BLOOD RIGHT ANTECUBITAL  Final   Special Requests   Final    BOTTLES DRAWN AEROBIC AND ANAEROBIC Blood Culture adequate volume   Culture   Final    NO GROWTH 2 DAYS Performed at [facility], [address], [place], [place] [address]    Report Status PENDING  Incomplete  Culture, blood (routine x 2)     Status: None (Preliminary result)   Collection  Time: 01/08/20  2:41 AM   Specimen: BLOOD  Result Value Ref Range Status   Specimen Description BLOOD LEFT ANTECUBITAL  Final   Special Requests   Final    BOTTLES DRAWN AEROBIC AND ANAEROBIC Blood Culture adequate volume   Culture   Final    NO GROWTH 2 DAYS Performed at [facility], [address], [place], [place] [address]    Report Status PENDING  Incomplete     Labs: BNP (last 3 results) Recent Labs    01/09/20 0418 01/10/20 0502  BNP 98.0 66.6   Basic Metabolic Panel: Recent Labs  Lab 01/07/20 1722 01/07/20 1723 01/09/20 0418 01/10/20 0502  NA 139 138 138 137  K 4.4 4.3 4.4 3.9  CL 106 104 105 105  CO2  --  21* 22 21*  GLUCOSE 106* 107* 98 92  BUN 25* 24* 23 17  CREATININE 1.40* 1.44* 1.31* 1.23*  CALCIUM  --  9.7 9.4 9.1  MG  --   --  1.8 1.7   Liver Function Tests: Recent Labs  Lab 01/07/20 1723 01/09/20 0418 01/10/20 0502  AST 33 34 27  ALT 17 14 14   ALKPHOS 80 78 76  BILITOT 1.3* 1.4* 1.2  PROT 7.1 6.5 6.4*  ALBUMIN 4.3 3.7 3.7   No results for input(s): LIPASE, AMYLASE in the last 168 hours. No results for input(s): AMMONIA in the last 168 hours. CBC: Recent Labs  Lab 01/07/20 1722 01/07/20 1723 01/09/20 0418 01/10/20 0502  WBC  --  10.3 7.0 7.0  NEUTROABS  --  6.2 3.6 3.9  HGB 12.9 12.8 12.0 11.3*  HCT 38.0 40.3 35.9* 34.1*  MCV  --  99.8 96.8 94.7  PLT  --  181 195 187   Cardiac Enzymes: No results for input(s): CKTOTAL, CKMB, CKMBINDEX, TROPONINI in the last 168 hours. BNP: Invalid input(s): POCBNP CBG: Recent Labs  Lab 01/07/20 1929  GLUCAP 101*   D-Dimer No results for input(s): DDIMER in the last 72 hours. Hgb A1c Recent Labs    01/08/20 0231  HGBA1C 5.3   Lipid Profile Recent Labs    01/08/20 0231  CHOL 136  HDL 44  LDLCALC 74  TRIG 89  CHOLHDL 3.1   Thyroid function studies No results for input(s): TSH, T4TOTAL, T3FREE, THYROIDAB in the last 72 hours.  Invalid input(s): FREET3 Anemia work  up No results for input(s): VITAMINB12, FOLATE, FERRITIN, TIBC, IRON, RETICCTPCT in the last 72 hours. Urinalysis No results found for: COLORURINE, APPEARANCEUR,  LABSPEC, PHURINE, GLUCOSEU, HGBUR, BILIRUBINUR, KETONESUR, PROTEINUR, UROBILINOGEN, NITRITE, LEUKOCYTESUR Sepsis Labs Invalid input(s): PROCALCITONIN,  WBC,  LACTICIDVEN Microbiology Recent Results (from the past 240 hour(s))  Respiratory Panel by RT PCR (Flu A&B, Covid) - Nasopharyngeal Swab     Status: None   Collection Time: 01/07/20  5:40 PM   Specimen: Nasopharyngeal Swab  Result Value Ref Range Status   SARS Coronavirus 2 by RT PCR NEGATIVE NEGATIVE Final    Comment: (NOTE) SARS-CoV-2 target nucleic acids are NOT DETECTED.  The SARS-CoV-2 RNA is generally detectable in upper respiratoy specimens during the acute phase of infection. The lowest concentration of SARS-CoV-2 viral copies this assay can detect is 131 copies/mL. A negative result does not preclude SARS-Cov-2 infection and should not be used as the sole basis for treatment or other patient management decisions. A negative result may occur with  improper specimen collection/handling, submission of specimen other than nasopharyngeal swab, presence of viral mutation(s) within the areas targeted by this assay, and inadequate number of viral copies (<131 copies/mL). A negative result must be combined with clinical observations, patient history, and epidemiological information. The expected result is Negative.  Fact Sheet for Patients:  https://www.moore.com/  Fact Sheet for Healthcare Providers:  https://www.young.biz/  This test is no t yet approved or cleared by the [place] FDA and  has been authorized for detection and/or diagnosis of SARS-CoV-2 by FDA under an Emergency Use Authorization (EUA). This EUA will remain  in effect (meaning this test can be used) for the duration of the COVID-19 declaration under  Section 564(b)(1) of the Act, 21 U.S.C. section 360bbb-3(b)(1), unless the authorization is terminated or revoked sooner.     Influenza A by PCR NEGATIVE NEGATIVE Final   Influenza B by PCR NEGATIVE NEGATIVE Final    Comment: (NOTE) The Xpert Xpress SARS-CoV-2/FLU/RSV assay is intended as an aid in  the diagnosis of influenza from Nasopharyngeal swab specimens and  should not be used as a sole basis for treatment. Nasal washings and  aspirates are unacceptable for Xpert Xpress SARS-CoV-2/FLU/RSV  testing.  Fact Sheet for Patients: https://www.moore.com/  Fact Sheet for Healthcare Providers: https://www.young.biz/  This test is not yet approved or cleared by the [place] FDA and  has been authorized for detection and/or diagnosis of SARS-CoV-2 by  FDA under an Emergency Use Authorization (EUA). This EUA will remain  in effect (meaning this test can be used) for the duration of the  Covid-19 declaration under Section 564(b)(1) of the Act, 21  U.S.C. section 360bbb-3(b)(1), unless the authorization is  terminated or revoked. Performed at [facility], [address] [place], [place] [address]   Culture, blood (routine x 2)     Status: None (Preliminary result)   Collection Time: 01/08/20  2:35 AM   Specimen: BLOOD  Result Value Ref Range Status   Specimen Description BLOOD RIGHT ANTECUBITAL  Final   Special Requests   Final    BOTTLES DRAWN AEROBIC AND ANAEROBIC Blood Culture adequate volume   Culture   Final    NO GROWTH 2 DAYS Performed at [facility], [address], [place], [place] [address]    Report Status PENDING  Incomplete  Culture, blood (routine x 2)     Status: None (Preliminary result)   Collection Time: 01/08/20  2:41 AM   Specimen: BLOOD  Result Value Ref Range Status   Specimen Description BLOOD LEFT ANTECUBITAL  Final   Special Requests   Final  BOTTLES DRAWN AEROBIC AND ANAEROBIC Blood Culture  adequate volume   Culture   Final    NO GROWTH 2 DAYS Performed at [facility], [address] [place], [place] [address]    Report Status PENDING  Incomplete     Time coordinating discharge: Over 30 minutes  SIGNED:   Azucena Fallen, DO [facility] 01/10/2020, 10:43 AM Pager   If 7PM-7AM, please contact night-coverage [email or web address]

## 2020-01-10 NOTE — Progress Notes (Signed)
Discharge papers and education was given to the patient and her family at the bedside. All questions are answered. VS stable.

## 2020-01-10 NOTE — Progress Notes (Signed)
STROKE TEAM PROGRESS NOTE   INTERVAL HISTORY Patient is sitting up in bed with her daughter.  She continues to have expressive aphasia with word finding difficulties and nonfluent speech but has good understanding.  TEE shows no cardiac source of embolism.  Patient has had a loop recorder inserted.  Vital signs are stable.  Patient is refusing to go to rehab and wants to go home.  OBJECTIVE Vitals:   01/10/20 0036 01/10/20 0429 01/10/20 0832 01/10/20 1154  BP: (!) 147/84 (!) 152/84 (!) 158/83 (!) 145/93  Pulse: 74 80 81 79  Resp: 14 18 20 16   Temp: 98.6 F (37 C) 98.5 F (36.9 C) 98.2 F (36.8 C) 97.7 F (36.5 C)  TempSrc: Oral Oral Oral Oral  SpO2: 96% 98% 92% 96%  Weight:      Height:       CBC:  Recent Labs  Lab 01/09/20 0418 01/10/20 0502  WBC 7.0 7.0  NEUTROABS 3.6 3.9  HGB 12.0 11.3*  HCT 35.9* 34.1*  MCV 96.8 94.7  PLT 195 187   Basic Metabolic Panel:  Recent Labs  Lab 01/09/20 0418 01/10/20 0502  NA 138 137  K 4.4 3.9  CL 105 105  CO2 22 21*  GLUCOSE 98 92  BUN 23 17  CREATININE 1.31* 1.23*  CALCIUM 9.4 9.1  MG 1.8 1.7   Lipid Panel:     Component Value Date/Time   CHOL 136 01/08/2020 0231   TRIG 89 01/08/2020 0231   HDL 44 01/08/2020 0231   CHOLHDL 3.1 01/08/2020 0231   VLDL 18 01/08/2020 0231   LDLCALC 74 01/08/2020 0231   HgbA1c:  Lab Results  Component Value Date   HGBA1C 5.3 01/08/2020    IMAGING ECHO TEE  Result Date: 01/09/2020    TRANSESOPHOGEAL ECHO REPORT   Patient Name:   LUBA MATZEN Date of Exam: 01/09/2020 Medical Rec #:  01/11/2020         Height:       65.0 in Accession #:    [id number]        Weight:       164.4 lb Date of Birth:  Dec 08, 1933         BSA:          1.820 m Patient Age:    86 years          BP:           160/80 mmHg Patient Gender: F                 HR:           90 bpm. Exam Location:  Inpatient Procedure: Transesophageal Echo Indications:    Stroke  History:        Patient has prior history of  Echocardiogram examinations.  Sonographer:    05/11/1933 RDCS (AE) Referring Phys: [id number] DAVID L RINEHULS PROCEDURE: After discussion of the risks and benefits of a TEE, an informed consent was obtained. The transesophogeal probe was passed without difficulty through the esophogus of the patient. Local oropharyngeal anesthetic was provided with Benzocaine spray. Sedation performed by different physician. Image quality was excellent. The patient's vital signs; including heart rate, blood pressure, and oxygen saturation; remained stable throughout the procedure. The patient developed no complications during  the procedure. IMPRESSIONS  1. No SOE patient will proceed with ILR implant with Dr Thurman Coyer.  2. Septal hypokinesis . Left ventricular ejection fraction, by estimation, is 50 to 55%. The left ventricle  has low normal function. The left ventricle demonstrates regional wall motion abnormalities (see scoring diagram/findings for description). There  is mild asymmetric left ventricular hypertrophy of the basal and septal segments.  3. Right ventricular systolic function is normal. The right ventricular size is normal.  4. No left atrial/left atrial appendage thrombus was detected.  5. The mitral valve is normal in structure. Mild mitral valve regurgitation. No evidence of mitral stenosis.  6. The aortic valve is normal in structure. Aortic valve regurgitation is not visualized. No aortic stenosis is present.  7. The inferior vena cava is normal in size with greater than 50% respiratory variability, suggesting right atrial pressure of 3 mmHg.  8. Agitated saline contrast bubble study was negative, with no evidence of any interatrial shunt. Conclusion(s)/Recommendation(s): Normal biventricular function without evidence of hemodynamically significant valvular heart disease. FINDINGS  Left Ventricle: Septal hypokinesis. Left ventricular ejection fraction, by estimation, is 50 to 55%. The left ventricle has low normal  function. The left ventricle demonstrates regional wall motion abnormalities. The left ventricular internal cavity size was normal in size. There is mild asymmetric left ventricular hypertrophy of the basal and septal segments. Right Ventricle: The right ventricular size is normal. No increase in right ventricular wall thickness. Right ventricular systolic function is normal. Left Atrium: Left atrial size was normal in size. No left atrial/left atrial appendage thrombus was detected. Right Atrium: Right atrial size was normal in size. Pericardium: There is no evidence of pericardial effusion. Mitral Valve: The mitral valve is normal in structure. Mild mitral valve regurgitation. No evidence of mitral valve stenosis. Tricuspid Valve: The tricuspid valve is normal in structure. Tricuspid valve regurgitation is mild . No evidence of tricuspid stenosis. Aortic Valve: The aortic valve is normal in structure. Aortic valve regurgitation is not visualized. No aortic stenosis is present. Pulmonic Valve: The pulmonic valve was normal in structure. Pulmonic valve regurgitation is trivial. No evidence of pulmonic stenosis. Aorta: The aortic root is normal in size and structure. Venous: The inferior vena cava is normal in size with greater than 50% respiratory variability, suggesting right atrial pressure of 3 mmHg. IAS/Shunts: No atrial level shunt detected by color flow Doppler. Agitated saline contrast was given intravenously to evaluate for intracardiac shunting. Agitated saline contrast bubble study was negative, with no evidence of any interatrial shunt. Additional Comments: No SOE patient will proceed with ILR implant with Dr Ladona Ridgel. Charlton Haws MD Electronically signed by Charlton Haws MD Signature Date/Time: 01/09/2020/2:01:59 PM    Final     PHYSICAL EXAM   Blood pressure (!) 145/93, pulse 79, temperature 97.7 F (36.5 C), temperature source Oral, resp. rate 16, height 5\' 6"  (1.676 m), weight 74.6 kg, SpO2 96  %.  Neurologic Exam:  Patient appears   alert, expressive aphasia, nonfluent speech with significant word hesitancy but able to follow simple commands easily however, blinks to threat bilaterally, extraocular movements intact bilaterally, pupils equally round and reactive to light, smile symmetric, states sensation intact normally, hearing intact to voice, midline tongue , patient is able to raise all 4 extremities antigravity without drift no focal deficit appreciated, normal tone and bulk throughout, no atrophy or abnormal movements, no ataxia or dysmetria.   ASSESSMENT/PLAN Ms. Karolina Zamor is a 86 y.o. 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  She did not receive IV t-PA due to mild deficits.  Stroke: Biateral infarcts - embolic - source unknown, suspect AF  Resultant expressive aphasia  Code Stroke CT Head - Slight progression of hypoattenuation in the high right frontoparietal region.  No acute hemorrhage.    MRI head - Acute high left frontal cortical infarct with additional punctate infarcts involving the right parietal lobe, right frontal lobe, and posterior left temporal lobe. Remote infarcts and chronic microvascular ischemic disease  MRA head - No LVO or significant stenosis. Similar appearance from 09/25/2018.   Carotid Doppler - L ICA 40-59% stenosis   LE Doppler - neg DVT  2D Echo - EF 60-65%. No source of embolus   TEE EF 50-55%, no SOE, neg bubble  loop recorder placement 01/09/20 Ladona Ridgel)  Loyal Jacobson Virus 2 - negative  LDL - 74  HgbA1c - 5.3  VTE prophylaxis - Lovenox  aspirin 325 mg daily prior to admission, now on aspirin 81 mg daily  Therapy recommendations:  HH PT, CIR, SLP  Disposition:  Pending  Hypertension  Home BP meds:  Cozaar  Current BP meds: none   Stable . Permissive hypertension (OK if < 220/120) but gradually normalize in 5-7 days  . Long-term BP goal normotensive  Hyperlipidemia  Home Lipid lowering medication:  Lipitor 40 mg daily   LDL 74, goal < 70  Current lipid lowering medication: Lipitor 40 mg daily   Continue statin at discharge  Other Stroke Risk Factors  Advanced age  Former cigarette smoker - quit  Previous ETOH use  Overweight, Body mass index is 26.55 kg/m., recommend weight loss, diet and exercise as appropriate   Hx stroke/TIA  Other Active Problems CKD - stage 3b - creatinine - 1.44->1.31  Hospital day # 3 Patient has had a cryptogenic stroke and work-up for source of embolism has been negative so far.  Recommend aspirin Plavix for 3 weeks followed by Plavix alone.  Long discussion with patient, daughter and Dr. Natale Milch and answered questions.  Greater than 50% time during this 25-minute visit was spent on counseling and coordination of care about her embolic stroke and discussion with evaluation treatment plan and answering questions.  Follow-up as an outpatient in the stroke clinic in 6 weeks. Delia Heady, MD To contact Stroke Continuity provider, please refer to [email or web address]. After hours, contact General Neurology

## 2020-01-10 NOTE — TOC Transition Note (Signed)
Transition of Care [facility]) - CM/SW Discharge Note   Patient Details  Name: Braylyn Eye MRN: [id number] Date of Birth: May 06, 1933  [facility] CM/SW Contact:  Kermit Balo, RN Phone Number: 01/10/2020, 11:07 AM   Clinical Narrative:    Pt discharging home with [facility] services through [facility]. Britney with [facility] accepted the referral.  Pts son says she has the walker and 3 in 1 at home.  Pt has needed supervision, transportation and med management at home.  Son to provide transport home.   Final next level of care: Home w Home Health Services Barriers to Discharge: No Barriers Identified   Patient Goals and CMS Choice   CMS Medicare.gov Compare Post Acute Care list provided to:: Patient Represenative (must comment) Choice offered to / list presented to : Adult Children  Discharge Placement                       Discharge Plan and Services   Discharge Planning Services: CM Consult                      HH Arranged: PT, OT, Speech Therapy HH Agency: [facility] Date [facility] Agency Contacted: 01/10/20   Representative spoke with at [facility] Agency: Brayton El  Social Determinants of Health (SDOH) Interventions     Readmission Risk Interventions No flowsheet data found.

## 2020-01-10 NOTE — Progress Notes (Signed)
Inpatient Rehab Admissions:  Inpatient Rehab Consult received.  Note recommendations now for home health f/u.  Will sign off for CIR at this time.   Signed: Estill Dooms, PT, DPT Admissions Coordinator [phone] 01/10/20  10:46 AM

## 2020-01-10 NOTE — Progress Notes (Signed)
Physical Therapy Treatment Patient Details Name: Shannon Nixon MRN: [id number] DOB: 1934-02-26 Today's Date: 01/10/2020    History of Present Illness 86 y.o. female with medical history significant of prior strokes most recently in 2020 at [facility], hypertension, OA, hyperlipidemia, CKD, adjustment disorder with depressed mood, and adjustment insomnia presented following onset of aphasia at home. MRI brain showed acute high left frontal cortical infarct with additional punctate infarcts in various lobes suggestive of embolic etiology.     PT Comments    Patient progressing well towards PT goals. Continues to have difficulty with aphasia and is visibly frustrated when trying to communicate. Tolerated stair training today with Min guard assist and use of rails for support. Requires repetition of cues to follow multi step commands. Noted to fatigue this date needing 2 seated rest breaks during gait/stairs. Eager to return home. Will do well at home with family supervision/assist. Will follow.    Follow Up Recommendations  Home health PT;Supervision/Assistance - 24 hour     Equipment Recommendations  Rolling walker with 5" wheels;3in1 (PT)    Recommendations for Other Services       Precautions / Restrictions Precautions Precautions: Fall Precaution Comments: expressive aphasia Restrictions Weight Bearing Restrictions: No    Mobility  Bed Mobility Overal bed mobility: Needs Assistance Bed Mobility: Supine to Sit     Supine to sit: Supervision;HOB elevated Sit to supine: Min assist;HOB elevated   General bed mobility comments: Assist to bring LEs into bed  Transfers Overall transfer level: Needs assistance Equipment used: Rolling walker (2 wheeled) Transfers: Sit to/from Stand Sit to Stand: Min assist         General transfer comment: steadying assist throughout; stood from EOB x1, from chair x2. Use of momentum.  Ambulation/Gait Ambulation/Gait assistance: Min  assist Gait Distance (Feet): 110 Feet (+ 50') Assistive device: Rolling walker (2 wheeled) Gait Pattern/deviations: Trunk flexed;Step-through pattern Gait velocity: reduced Gait velocity interpretation: <1.8 ft/sec, indicate of risk for recurrent falls General Gait Details: Slow, mildly unsteady gait wtih cues for management/placement of left hand, veers left and needs assist for RW management. 2 seated rest breaks after walking to stairs and post stairs.   Stairs Stairs: Yes Stairs assistance: Min guard Stair Management: Two rails;Alternating pattern;Step to pattern Number of Stairs: 5 (x2 bouts) General stair comments: Cues for technique/safety   Wheelchair Mobility    Modified Rankin (Stroke Patients Only) Modified Rankin (Stroke Patients Only) Pre-Morbid Rankin Score: Slight disability Modified Rankin: Moderately severe disability     Balance Overall balance assessment: Needs assistance Sitting-balance support: Feet supported;No upper extremity supported Sitting balance-Leahy Scale: Fair Sitting balance - Comments: pt able to sit EOB without UE support today   Standing balance support: During functional activity Standing balance-Leahy Scale: Poor Standing balance comment: external assist for balance                             Cognition Arousal/Alertness: Awake/alert Behavior During Therapy: WFL for tasks assessed/performed Overall Cognitive Status: Difficult to assess                                 General Comments: Difficulty following multi step commands consistently, needs repetition. Worked on automatic parts of speech, numbers, days of week etc. Pt visibly frustrated. Able to state name, place and month wtih options.      Exercises  General Comments        Pertinent Vitals/Pain Pain Assessment: Faces Faces Pain Scale: No hurt    Home Living                      Prior Function            PT Goals (current  goals can now be found in the care plan section) Progress towards PT goals: Progressing toward goals    Frequency    Min 4X/week      PT Plan Current plan remains appropriate    Co-evaluation              AM-PAC PT "6 Clicks" Mobility   Outcome Measure  Help needed turning from your back to your side while in a flat bed without using bedrails?: None Help needed moving from lying on your back to sitting on the side of a flat bed without using bedrails?: None Help needed moving to and from a bed to a chair (including a wheelchair)?: A Little Help needed standing up from a chair using your arms (e.g., wheelchair or bedside chair)?: A Little Help needed to walk in hospital room?: A Little Help needed climbing 3-5 steps with a railing? : A Little 6 Click Score: 20    End of Session Equipment Utilized During Treatment: Gait belt Activity Tolerance: Patient tolerated treatment well;Patient limited by fatigue Patient left: in bed;with call bell/phone within reach;with bed alarm set Nurse Communication: Mobility status PT Visit Diagnosis: Unsteadiness on feet (R26.81);Other abnormalities of gait and mobility (R26.89);Muscle weakness (generalized) (M62.81);Other symptoms and signs involving the nervous system (T55.732)     Time: 2025-4270 PT Time Calculation (min) (ACUTE ONLY): 21 min  Charges:  $Gait Training: 8-22 mins                     Vale Haven, PT, DPT Acute Rehabilitation Services Pager [phone] Office [phone]       Blake Divine A Dejion Grillo 01/10/2020, 10:08 AM

## 2020-01-13 ENCOUNTER — Other Ambulatory Visit: Payer: Self-pay

## 2020-01-13 LAB — CULTURE, BLOOD (ROUTINE X 2)
Culture: NO GROWTH
Culture: NO GROWTH
Special Requests: ADEQUATE
Special Requests: ADEQUATE

## 2020-01-13 NOTE — Patient Outreach (Signed)
[facility]) Care Management  01/13/2020  Dannae Kato March 23, 1933 [id number]   EMMI- Stroke RED ON EMMI ALERT Day # 1 Date: 01/12/20 Red Alert Reason:  Feeling worse overall? Yes  Able to eat and drink? No  Questions/problems with meds? Yes  Scheduled a follow-up appointment? No    Outreach attempt: spoke with daughter in law, who states patient cannot talk. Discussed red alerts. She denies any problems presently.   Advised that they would continue to get automated EMMI-Stroke post discharge calls to assess how they are doing following recent hospitalization and will receive a call from a nurse if any of their responses were abnormal. She voiced understanding and was appreciative of follow up call.    Plan: RN CM will close case.    Bary Leriche, RN, MSN [facility] Care Management Care Management Coordinator Direct Line [phone] Toll Free: [phone]  Fax: [phone]

## 2020-01-19 ENCOUNTER — Ambulatory Visit: Payer: Medicare HMO

## 2020-01-24 ENCOUNTER — Other Ambulatory Visit: Payer: Self-pay

## 2020-01-24 ENCOUNTER — Ambulatory Visit (INDEPENDENT_AMBULATORY_CARE_PROVIDER_SITE_OTHER): Payer: Medicare HMO | Admitting: Emergency Medicine

## 2020-01-24 DIAGNOSIS — I639 Cerebral infarction, unspecified: Secondary | ICD-10-CM

## 2020-01-24 LAB — CUP PACEART INCLINIC DEVICE CHECK
Date Time Interrogation Session: 20211109115542
Implantable Pulse Generator Implant Date: 20211025

## 2020-01-24 NOTE — Progress Notes (Signed)
ILR wound check in clinic. Steri strips removed. Wound well healed. Home monitor transmitting nightly. No episodes. Questions answered.  

## 2020-02-13 ENCOUNTER — Ambulatory Visit (INDEPENDENT_AMBULATORY_CARE_PROVIDER_SITE_OTHER): Payer: Medicare HMO

## 2020-02-13 DIAGNOSIS — I639 Cerebral infarction, unspecified: Secondary | ICD-10-CM | POA: Diagnosis not present

## 2020-02-13 LAB — CUP PACEART REMOTE DEVICE CHECK
Date Time Interrogation Session: 20211128122625
Implantable Pulse Generator Implant Date: 20211025

## 2020-02-17 NOTE — Progress Notes (Signed)
Carelink Summary Report / Loop Recorder 

## 2020-03-19 ENCOUNTER — Ambulatory Visit (INDEPENDENT_AMBULATORY_CARE_PROVIDER_SITE_OTHER): Payer: Medicare HMO

## 2020-03-19 DIAGNOSIS — I639 Cerebral infarction, unspecified: Secondary | ICD-10-CM | POA: Diagnosis not present

## 2020-03-20 LAB — CUP PACEART REMOTE DEVICE CHECK
Date Time Interrogation Session: 20211231122518
Implantable Pulse Generator Implant Date: 20211025

## 2020-04-03 NOTE — Progress Notes (Signed)
Carelink Summary Report / Loop Recorder 

## 2020-04-20 LAB — CUP PACEART REMOTE DEVICE CHECK
Date Time Interrogation Session: 20220202122428
Implantable Pulse Generator Implant Date: 20211025

## 2020-04-23 ENCOUNTER — Ambulatory Visit (INDEPENDENT_AMBULATORY_CARE_PROVIDER_SITE_OTHER): Payer: Medicare HMO

## 2020-04-23 DIAGNOSIS — I639 Cerebral infarction, unspecified: Secondary | ICD-10-CM

## 2020-04-30 NOTE — Progress Notes (Signed)
Carelink Summary Report / Loop Recorder 

## 2020-05-28 ENCOUNTER — Ambulatory Visit (INDEPENDENT_AMBULATORY_CARE_PROVIDER_SITE_OTHER): Payer: Medicare HMO

## 2020-05-28 DIAGNOSIS — I639 Cerebral infarction, unspecified: Secondary | ICD-10-CM

## 2020-05-28 LAB — CUP PACEART REMOTE DEVICE CHECK
Date Time Interrogation Session: 20220307122049
Implantable Pulse Generator Implant Date: 20211025

## 2020-06-04 NOTE — Progress Notes (Signed)
Carelink Summary Report / Loop Recorder 

## 2020-07-02 ENCOUNTER — Ambulatory Visit (INDEPENDENT_AMBULATORY_CARE_PROVIDER_SITE_OTHER): Payer: Medicare HMO

## 2020-07-02 DIAGNOSIS — I639 Cerebral infarction, unspecified: Secondary | ICD-10-CM | POA: Diagnosis not present

## 2020-07-03 LAB — CUP PACEART REMOTE DEVICE CHECK
Date Time Interrogation Session: 20220409122617
Implantable Pulse Generator Implant Date: 20211025

## 2020-07-17 NOTE — Progress Notes (Signed)
Carelink Summary Report / Loop Recorder 

## 2020-07-27 ENCOUNTER — Ambulatory Visit (INDEPENDENT_AMBULATORY_CARE_PROVIDER_SITE_OTHER): Payer: Medicare HMO

## 2020-07-27 DIAGNOSIS — I639 Cerebral infarction, unspecified: Secondary | ICD-10-CM | POA: Diagnosis not present

## 2020-07-27 LAB — CUP PACEART REMOTE DEVICE CHECK
Date Time Interrogation Session: 20220512122139
Implantable Pulse Generator Implant Date: 20211025

## 2020-08-09 NOTE — Progress Notes (Signed)
Carelink Summary Report / Loop Recorder 

## 2020-08-29 ENCOUNTER — Ambulatory Visit (INDEPENDENT_AMBULATORY_CARE_PROVIDER_SITE_OTHER): Payer: Medicare HMO

## 2020-08-29 DIAGNOSIS — I639 Cerebral infarction, unspecified: Secondary | ICD-10-CM

## 2020-08-30 LAB — CUP PACEART REMOTE DEVICE CHECK
Date Time Interrogation Session: 20220614122300
Implantable Pulse Generator Implant Date: 20211025

## 2020-09-20 NOTE — Progress Notes (Signed)
Carelink Summary Report / Loop Recorder 

## 2020-10-01 ENCOUNTER — Ambulatory Visit (INDEPENDENT_AMBULATORY_CARE_PROVIDER_SITE_OTHER): Payer: Medicare HMO

## 2020-10-01 DIAGNOSIS — I639 Cerebral infarction, unspecified: Secondary | ICD-10-CM | POA: Diagnosis not present

## 2020-10-02 LAB — CUP PACEART REMOTE DEVICE CHECK
Date Time Interrogation Session: 20220717122040
Implantable Pulse Generator Implant Date: 20211025

## 2020-10-23 NOTE — Progress Notes (Signed)
Carelink Summary Report / Loop Recorder 

## 2020-11-05 ENCOUNTER — Ambulatory Visit (INDEPENDENT_AMBULATORY_CARE_PROVIDER_SITE_OTHER): Payer: Medicare HMO

## 2020-11-05 DIAGNOSIS — I639 Cerebral infarction, unspecified: Secondary | ICD-10-CM | POA: Diagnosis not present

## 2020-11-06 LAB — CUP PACEART REMOTE DEVICE CHECK
Date Time Interrogation Session: 20220819122151
Implantable Pulse Generator Implant Date: 20211025

## 2020-11-21 NOTE — Progress Notes (Signed)
Carelink Summary Report / Loop Recorder 

## 2020-12-10 ENCOUNTER — Ambulatory Visit (INDEPENDENT_AMBULATORY_CARE_PROVIDER_SITE_OTHER): Payer: Medicare HMO

## 2020-12-10 DIAGNOSIS — I639 Cerebral infarction, unspecified: Secondary | ICD-10-CM

## 2020-12-10 LAB — CUP PACEART REMOTE DEVICE CHECK
Date Time Interrogation Session: 20220921122427
Implantable Pulse Generator Implant Date: 20211025

## 2020-12-17 NOTE — Progress Notes (Signed)
Carelink Summary Report / Loop Recorder 

## 2021-01-08 LAB — CUP PACEART REMOTE DEVICE CHECK
Date Time Interrogation Session: 20221024122046
Implantable Pulse Generator Implant Date: 20211025

## 2021-01-14 ENCOUNTER — Ambulatory Visit (INDEPENDENT_AMBULATORY_CARE_PROVIDER_SITE_OTHER): Payer: Medicare HMO

## 2021-01-14 DIAGNOSIS — I639 Cerebral infarction, unspecified: Secondary | ICD-10-CM

## 2021-01-16 ENCOUNTER — Other Ambulatory Visit: Payer: Self-pay

## 2021-01-16 NOTE — Telephone Encounter (Signed)
Received a call from the patients daughter, she is on DPR, requesting a refill of Plavix for her mother.   Reviewed chart but I do not see where the patient has been seen in our office except for pacer checks. I advised the patients daughter that I would speak with a nurse and get back to her. She voiced understanding.

## 2021-01-21 ENCOUNTER — Telehealth: Payer: Self-pay | Admitting: Internal Medicine

## 2021-01-21 NOTE — Telephone Encounter (Signed)
Pt has an appt with Francis Dowse, PA on 01/28/21. Would Renee,PA, like to refill Clopidogrel until appt time? Please address

## 2021-01-21 NOTE — Progress Notes (Signed)
Carelink Summary Report / Loop Recorder 

## 2021-01-21 NOTE — Telephone Encounter (Signed)
*  STAT* If patient is at the pharmacy, call can be transferred to refill team.   1. Which medications need to be refilled? (please list name of each medication and dose if known)  clopidogrel (PLAVIX) 75 MG tablet  2. Which pharmacy/location (including street and city if local pharmacy) is medication to be sent to? [id number] - [place], [place] - [address]  3. Do they need a 30 day or 90 day supply? 90 with refills  Patient is scheduled to see Francis Dowse 01/28/21   Patient is out of medication

## 2021-01-21 NOTE — Telephone Encounter (Signed)
Spoke with [place] on Wednesday who stated she spoke with the Device Clinic Nurses who states the patient is only seen for pacer checks and they recommended the patient get the refill from her neurologist. The patient has the pacer in from having a stroke.   Looks like the patient has called back and scheduled an appointment with Francis Dowse, PA and is requesting a refill.

## 2021-01-22 NOTE — Telephone Encounter (Signed)
January 22, 2021 Sheilah Pigeon, PA-C to Shannon Nixon, CMA  Landon Pyle D, CMA     10:55 AM  I think I already addressed this in another message/refill note.  No, she needs to get her refills from her PMD or neurologist   Thanks  renee    Spoke with patients daughter and made her aware to get rx from pcp or neurologist. She states her mom does not have a neurologist. She states she call the patients pcp but they told her to follow up with cardiology. I explained to the patients daughter that we only see the patient to device checks. She states that if we not going to refill the medication then maybe her mom does not need it.   I called the patients pcp to see if they could refill the medication, but I had to leave a message. Im waiting for a call back.

## 2021-01-22 NOTE — Telephone Encounter (Signed)
Received a call back from CMA at [facility] office. I explained to her that we do not see the patient and only do divide checks with the patient. She states she will send a message to [place], [place] and see what she says. She also said that she would follow up with the patients daughter from here.   Spoke with the patients daughter, Shannon Nixon, ok per DPR, and made her aware that someone from [place]'s office will be in touch with her. She voiced understanding.

## 2021-01-23 NOTE — Telephone Encounter (Signed)
Called pt to inform her that she would have to reach out to her neurologist for refill for her clopidogrel. Pt stated that someone had already called her and informed her of this. I advised the pt that if she has any other problems, questions or concerns, to give our office a call back. Pt verbalized understanding.

## 2021-01-26 NOTE — Progress Notes (Deleted)
Cardiology Office Note Date:  01/26/2021  Patient ID:  Shannon Nixon, DOB [birth year], MRN [id number] PCP:  Burnis Medin, PA-C  Electrophysiologist: Dr. Ladona Ridgel  ***refresh   Chief Complaint: *** annual visit  History of Present Illness: Shannon Nixon is a 87 y.o. female with history of HTN, HLD, OA, cryptogenic stroke > loop  She comes in today to be seen for Dr. Ladona Ridgel, last seen by him at the time of her consult/ILR implant Oct 2021  The patient recently called requesting plavix refills, was given an appointment, though advised that her neurologist or PMD would need to address her plavix refills.  *** af? *** symptoms  Device information MDT LINQ II implanted 01/08/21, cryptogenic stroke   Past Medical History:  Diagnosis Date   Adjustment disorder    CKD (chronic kidney disease)    Insomnia    Stroke (cerebrum) [facility])     Past Surgical History:  Procedure Laterality Date   BUBBLE STUDY  01/09/2020   Procedure: BUBBLE STUDY;  Surgeon: Wendall Stade, MD;  Location: [facility] ENDOSCOPY;  Service: Cardiovascular;;   KNEE ARTHROSCOPY     LOOP RECORDER INSERTION N/A 01/09/2020   Procedure: LOOP RECORDER INSERTION;  Surgeon: Marinus Maw, MD;  Location: [facility] INVASIVE CV LAB;  Service: Cardiovascular;  Laterality: N/A;   REPLACEMENT TOTAL KNEE Left    REPLACEMENT TOTAL KNEE Left    TEE WITHOUT CARDIOVERSION N/A 01/09/2020   Procedure: TRANSESOPHAGEAL ECHOCARDIOGRAM (TEE);  Surgeon: Wendall Stade, MD;  Location: [facility] ENDOSCOPY;  Service: Cardiovascular;  Laterality: N/A;   TOTAL SHOULDER REPLACEMENT Right    VAGINAL HYSTERECTOMY      Current Outpatient Medications  Medication Sig Dispense Refill   atorvastatin (LIPITOR) 40 MG tablet Take 40 mg by mouth daily.     citalopram (CELEXA) 10 MG tablet Take 10 mg by mouth daily.     clopidogrel (PLAVIX) 75 MG tablet Take 1 tablet (75 mg total) by mouth daily. 30 tablet 11   COLLAGEN PO Take 1 capsule by mouth in  the morning and at bedtime.     gabapentin (NEURONTIN) 100 MG capsule Take 100 mg by mouth at bedtime.     losartan (COZAAR) 25 MG tablet Take 25 mg by mouth daily.     mirtazapine (REMERON) 15 MG tablet Take 15 mg by mouth at bedtime.     Multiple Vitamins-Minerals (MULTIVITAMIN WITH MINERALS) tablet Take 1 tablet by mouth daily.     omeprazole (PRILOSEC) 40 MG capsule Take 40 mg by mouth daily.     traZODone (DESYREL) 100 MG tablet Take 100 mg by mouth at bedtime.     No current facility-administered medications for this visit.    Allergies:   Patient has no known allergies.   Social History:  The patient  reports that she quit smoking about 3 years ago. Her smoking use included cigarettes. She smoked an average of 1 pack per day. She has never used smokeless tobacco. She reports current alcohol use. She reports that she does not use drugs.   Family History:  The patient's family history includes Heart disease in her father.  ROS:  Please see the history of present illness.    All other systems are reviewed and otherwise negative.   PHYSICAL EXAM:  VS:  There were no vitals taken for this visit. BMI: There is no height or weight on file to calculate BMI. Well nourished, well developed, in no acute distress HEENT: normocephalic, atraumatic Neck: no JVD, carotid  bruits or masses Cardiac:  *** RRR; no significant murmurs, no rubs, or gallops Lungs:  *** CTA b/l, no wheezing, rhonchi or rales Abd: soft, nontender MS: no deformity or *** atrophy Ext: *** no edema Skin: warm and dry, no rash Neuro:  No gross deficits appreciated Psych: euthymic mood, full affect  *** ILR site is stable, no tethering or discomfort   EKG:  Done today and reviewed by myself shows  ***  Device interrogation done today and reviewed by myself:  ***    01/09/2020: TEE IMPRESSIONS   1. No SOE patient will proceed with ILR implant with Dr Lovena Le.   2. Septal hypokinesis . Left ventricular ejection  fraction, by  estimation, is 50 to 55%. The left ventricle has low normal function. The  left ventricle demonstrates regional wall motion abnormalities (see  scoring diagram/findings for description). There   is mild asymmetric left ventricular hypertrophy of the basal and septal  segments.   3. Right ventricular systolic function is normal. The right ventricular  size is normal.   4. No left atrial/left atrial appendage thrombus was detected.   5. The mitral valve is normal in structure. Mild mitral valve  regurgitation. No evidence of mitral stenosis.   6. The aortic valve is normal in structure. Aortic valve regurgitation is  not visualized. No aortic stenosis is present.   7. The inferior vena cava is normal in size with greater than 50%  respiratory variability, suggesting right atrial pressure of 3 mmHg.   8. Agitated saline contrast bubble study was negative, with no evidence  of any interatrial shunt.   Conclusion(s)/Recommendation(s): Normal biventricular function without  evidence of hemodynamically significant valvular heart disease.    01/08/2020: TTE IMPRESSIONS   1. Left ventricular ejection fraction, by estimation, is 60 to 65%. The  left ventricle has normal function. The left ventricle has no regional  wall motion abnormalities. There is mild left ventricular hypertrophy.  Left ventricular diastolic parameters  are consistent with Grade I diastolic dysfunction (impaired relaxation).   2. Right ventricular systolic function is normal. The right ventricular  size is normal. There is normal pulmonary artery systolic pressure. The  estimated right ventricular systolic pressure is [id number] mmHg.   3. The mitral valve is grossly normal. Mild mitral valve regurgitation.  No evidence of mitral stenosis.   4. The aortic valve is tricuspid. There is mild calcification of the  aortic valve. There is mild thickening of the aortic valve. Aortic valve  regurgitation is not  visualized. Mild aortic valve sclerosis is present,  with no evidence of aortic valve  stenosis.   5. The inferior vena cava is normal in size with greater than 50%  respiratory variability, suggesting right atrial pressure of 3 mmHg.    Recent Labs: No results found for requested labs within last 8760 hours.  No results found for requested labs within last 8760 hours.   CrCl cannot be calculated (Patient's most recent lab result is older than the maximum 21 days allowed.).   Wt Readings from Last 3 Encounters:  01/09/20 164 lb 7.4 oz (74.6 kg)  02/06/18 140 lb (63.5 kg)     Other studies reviewed: Additional studies/records reviewed today include: summarized above  ASSESSMENT AND PLAN:  Cryptogenic stroke ILR ***  HTN ***  Disposition: F/u with ***  Current medicines are reviewed at length with the patient today.  The patient did not have any concerns regarding medicines.  Venetia Night, PA-C 01/26/2021 10:01 AM  [address]  [place] [phone] (office)  [phone] (fax)

## 2021-01-28 ENCOUNTER — Encounter: Payer: Medicare HMO | Admitting: Physician Assistant

## 2021-02-10 LAB — CUP PACEART REMOTE DEVICE CHECK
Date Time Interrogation Session: 20221126122354
Implantable Pulse Generator Implant Date: 20211025

## 2021-02-18 ENCOUNTER — Ambulatory Visit (INDEPENDENT_AMBULATORY_CARE_PROVIDER_SITE_OTHER): Payer: Medicare HMO

## 2021-02-18 DIAGNOSIS — I639 Cerebral infarction, unspecified: Secondary | ICD-10-CM

## 2021-02-27 NOTE — Progress Notes (Signed)
Carelink Summary Report / Loop Recorder 

## 2021-03-25 ENCOUNTER — Ambulatory Visit (INDEPENDENT_AMBULATORY_CARE_PROVIDER_SITE_OTHER): Payer: Medicare HMO

## 2021-03-25 DIAGNOSIS — I639 Cerebral infarction, unspecified: Secondary | ICD-10-CM | POA: Diagnosis not present

## 2021-03-25 LAB — CUP PACEART REMOTE DEVICE CHECK
Date Time Interrogation Session: 20230108231143
Implantable Pulse Generator Implant Date: 20211025

## 2021-04-03 NOTE — Progress Notes (Signed)
Carelink Summary Report / Loop Recorder 

## 2021-04-29 ENCOUNTER — Ambulatory Visit (INDEPENDENT_AMBULATORY_CARE_PROVIDER_SITE_OTHER): Payer: Medicare HMO

## 2021-04-29 DIAGNOSIS — I639 Cerebral infarction, unspecified: Secondary | ICD-10-CM

## 2021-04-29 LAB — CUP PACEART REMOTE DEVICE CHECK
Date Time Interrogation Session: 20230212230920
Implantable Pulse Generator Implant Date: 20211025

## 2021-05-01 NOTE — Progress Notes (Signed)
Carelink Summary Report / Loop Recorder 

## 2021-06-03 ENCOUNTER — Ambulatory Visit (INDEPENDENT_AMBULATORY_CARE_PROVIDER_SITE_OTHER): Payer: Medicare HMO

## 2021-06-03 DIAGNOSIS — I639 Cerebral infarction, unspecified: Secondary | ICD-10-CM | POA: Diagnosis not present

## 2021-06-04 LAB — CUP PACEART REMOTE DEVICE CHECK
Date Time Interrogation Session: 20230317230822
Implantable Pulse Generator Implant Date: 20211025

## 2021-06-18 NOTE — Progress Notes (Signed)
Carelink Summary Report / Loop Recorder 

## 2021-07-04 LAB — CUP PACEART REMOTE DEVICE CHECK
Date Time Interrogation Session: 20230419230551
Implantable Pulse Generator Implant Date: 20211025

## 2021-07-08 ENCOUNTER — Ambulatory Visit (INDEPENDENT_AMBULATORY_CARE_PROVIDER_SITE_OTHER): Payer: Medicare HMO

## 2021-07-08 DIAGNOSIS — I639 Cerebral infarction, unspecified: Secondary | ICD-10-CM | POA: Diagnosis not present

## 2021-07-23 NOTE — Progress Notes (Signed)
Carelink Summary Report / Loop Recorder 

## 2021-08-12 LAB — CUP PACEART REMOTE DEVICE CHECK
Date Time Interrogation Session: 20230522231030
Implantable Pulse Generator Implant Date: 20211025

## 2021-08-13 ENCOUNTER — Ambulatory Visit (INDEPENDENT_AMBULATORY_CARE_PROVIDER_SITE_OTHER): Payer: Medicare HMO

## 2021-08-13 DIAGNOSIS — I639 Cerebral infarction, unspecified: Secondary | ICD-10-CM

## 2021-08-26 NOTE — Progress Notes (Signed)
Carelink Summary Report / Loop Recorder 

## 2021-09-16 ENCOUNTER — Ambulatory Visit (INDEPENDENT_AMBULATORY_CARE_PROVIDER_SITE_OTHER): Payer: Medicare HMO

## 2021-09-16 DIAGNOSIS — I639 Cerebral infarction, unspecified: Secondary | ICD-10-CM | POA: Diagnosis not present

## 2021-09-17 LAB — CUP PACEART REMOTE DEVICE CHECK
Date Time Interrogation Session: 20230702230505
Implantable Pulse Generator Implant Date: 20211025

## 2021-10-07 NOTE — Progress Notes (Signed)
Carelink Summary Report / Loop Recorder 

## 2021-10-21 ENCOUNTER — Ambulatory Visit (INDEPENDENT_AMBULATORY_CARE_PROVIDER_SITE_OTHER): Payer: Medicare HMO

## 2021-10-21 DIAGNOSIS — I639 Cerebral infarction, unspecified: Secondary | ICD-10-CM

## 2021-10-21 LAB — CUP PACEART REMOTE DEVICE CHECK
Date Time Interrogation Session: 20230804231256
Implantable Pulse Generator Implant Date: 20211025

## 2021-11-25 ENCOUNTER — Ambulatory Visit (INDEPENDENT_AMBULATORY_CARE_PROVIDER_SITE_OTHER): Payer: Medicare HMO

## 2021-11-25 DIAGNOSIS — I639 Cerebral infarction, unspecified: Secondary | ICD-10-CM

## 2021-11-26 NOTE — Progress Notes (Signed)
Carelink Summary Report / Loop Recorder 

## 2021-11-30 LAB — CUP PACEART REMOTE DEVICE CHECK
Date Time Interrogation Session: 20230906231237
Implantable Pulse Generator Implant Date: 20211025

## 2021-12-12 NOTE — Progress Notes (Signed)
Carelink Summary Report / Loop Recorder 

## 2021-12-26 LAB — CUP PACEART REMOTE DEVICE CHECK
Date Time Interrogation Session: 20231009231041
Implantable Pulse Generator Implant Date: 20211025

## 2021-12-30 ENCOUNTER — Ambulatory Visit (INDEPENDENT_AMBULATORY_CARE_PROVIDER_SITE_OTHER): Payer: Medicare HMO

## 2021-12-30 DIAGNOSIS — I639 Cerebral infarction, unspecified: Secondary | ICD-10-CM

## 2022-01-24 NOTE — Progress Notes (Signed)
Carelink Summary Report / Loop Recorder 

## 2022-02-03 ENCOUNTER — Ambulatory Visit (INDEPENDENT_AMBULATORY_CARE_PROVIDER_SITE_OTHER): Payer: Medicare HMO

## 2022-02-03 DIAGNOSIS — I639 Cerebral infarction, unspecified: Secondary | ICD-10-CM | POA: Diagnosis not present

## 2022-02-04 LAB — CUP PACEART REMOTE DEVICE CHECK
Date Time Interrogation Session: 20231119231151
Implantable Pulse Generator Implant Date: 20211025

## 2022-03-11 ENCOUNTER — Ambulatory Visit (INDEPENDENT_AMBULATORY_CARE_PROVIDER_SITE_OTHER): Payer: Medicare HMO

## 2022-03-11 DIAGNOSIS — I639 Cerebral infarction, unspecified: Secondary | ICD-10-CM

## 2022-03-11 LAB — CUP PACEART REMOTE DEVICE CHECK
Date Time Interrogation Session: 20231225231658
Implantable Pulse Generator Implant Date: 20211025

## 2022-03-13 NOTE — Progress Notes (Signed)
Carelink Summary Report / Loop Recorder 

## 2022-04-03 NOTE — Progress Notes (Signed)
Carelink Summary Report / Loop Recorder

## 2022-04-14 ENCOUNTER — Ambulatory Visit: Payer: Medicare HMO

## 2022-04-14 DIAGNOSIS — I639 Cerebral infarction, unspecified: Secondary | ICD-10-CM | POA: Diagnosis not present

## 2022-04-15 LAB — CUP PACEART REMOTE DEVICE CHECK
Date Time Interrogation Session: 20240127231001
Implantable Pulse Generator Implant Date: 20211025

## 2022-05-19 ENCOUNTER — Ambulatory Visit (INDEPENDENT_AMBULATORY_CARE_PROVIDER_SITE_OTHER): Payer: Medicare HMO

## 2022-05-19 DIAGNOSIS — I639 Cerebral infarction, unspecified: Secondary | ICD-10-CM

## 2022-05-20 LAB — CUP PACEART REMOTE DEVICE CHECK
Date Time Interrogation Session: 20240229230547
Implantable Pulse Generator Implant Date: 20211025

## 2022-05-30 NOTE — Progress Notes (Signed)
Carelink Summary Report / Loop Recorder 

## 2022-06-23 ENCOUNTER — Ambulatory Visit (INDEPENDENT_AMBULATORY_CARE_PROVIDER_SITE_OTHER): Payer: Medicare HMO

## 2022-06-23 DIAGNOSIS — I639 Cerebral infarction, unspecified: Secondary | ICD-10-CM | POA: Diagnosis not present

## 2022-06-24 LAB — CUP PACEART REMOTE DEVICE CHECK
Date Time Interrogation Session: 20240407231116
Implantable Pulse Generator Implant Date: 20211025

## 2022-06-30 NOTE — Progress Notes (Signed)
Carelink Summary Report / Loop Recorder 

## 2022-07-25 LAB — CUP PACEART REMOTE DEVICE CHECK: Date Time Interrogation Session: 20240510230559

## 2022-07-28 ENCOUNTER — Ambulatory Visit (INDEPENDENT_AMBULATORY_CARE_PROVIDER_SITE_OTHER): Payer: Medicare HMO

## 2022-07-28 DIAGNOSIS — I639 Cerebral infarction, unspecified: Secondary | ICD-10-CM | POA: Diagnosis not present

## 2022-07-28 LAB — CUP PACEART REMOTE DEVICE CHECK: Implantable Pulse Generator Implant Date: 20211025

## 2022-07-31 NOTE — Progress Notes (Signed)
Carelink Summary Report / Loop Recorder 

## 2022-08-21 NOTE — Progress Notes (Signed)
Carelink Summary Report / Loop Recorder 

## 2022-09-01 ENCOUNTER — Ambulatory Visit: Payer: Medicare HMO

## 2022-09-01 DIAGNOSIS — I639 Cerebral infarction, unspecified: Secondary | ICD-10-CM | POA: Diagnosis not present

## 2022-09-01 LAB — CUP PACEART REMOTE DEVICE CHECK
Date Time Interrogation Session: 20240616231058
Implantable Pulse Generator Implant Date: 20211025

## 2022-09-23 NOTE — Progress Notes (Signed)
Carelink Summary Report / Loop Recorder 

## 2022-10-06 ENCOUNTER — Ambulatory Visit: Payer: Medicare HMO

## 2022-10-06 DIAGNOSIS — I639 Cerebral infarction, unspecified: Secondary | ICD-10-CM | POA: Diagnosis not present

## 2022-10-08 LAB — CUP PACEART REMOTE DEVICE CHECK
Date Time Interrogation Session: 20240719230137
Implantable Pulse Generator Implant Date: 20211025

## 2022-10-24 NOTE — Progress Notes (Signed)
Carelink Summary Report / Loop Recorder 

## 2022-11-06 LAB — CUP PACEART REMOTE DEVICE CHECK
Date Time Interrogation Session: 20240821230735
Implantable Pulse Generator Implant Date: 20211025

## 2022-11-10 ENCOUNTER — Ambulatory Visit (INDEPENDENT_AMBULATORY_CARE_PROVIDER_SITE_OTHER): Payer: Medicare HMO

## 2022-11-10 DIAGNOSIS — I639 Cerebral infarction, unspecified: Secondary | ICD-10-CM

## 2022-11-19 NOTE — Progress Notes (Signed)
Carelink Summary Report / Loop Recorder 

## 2022-12-10 LAB — CUP PACEART REMOTE DEVICE CHECK
Date Time Interrogation Session: 20240923230634
Implantable Pulse Generator Implant Date: 20211025

## 2022-12-15 ENCOUNTER — Ambulatory Visit (INDEPENDENT_AMBULATORY_CARE_PROVIDER_SITE_OTHER): Payer: Medicare HMO

## 2022-12-15 DIAGNOSIS — I639 Cerebral infarction, unspecified: Secondary | ICD-10-CM

## 2022-12-30 NOTE — Progress Notes (Signed)
Carelink Summary Report / Loop Recorder 

## 2023-01-13 LAB — CUP PACEART REMOTE DEVICE CHECK
Date Time Interrogation Session: 20241026230209
Implantable Pulse Generator Implant Date: 20211025

## 2023-01-19 ENCOUNTER — Ambulatory Visit (INDEPENDENT_AMBULATORY_CARE_PROVIDER_SITE_OTHER): Payer: Medicare HMO

## 2023-01-19 DIAGNOSIS — I639 Cerebral infarction, unspecified: Secondary | ICD-10-CM

## 2023-02-16 NOTE — Progress Notes (Signed)
Carelink Summary Report / Loop Recorder 

## 2023-02-23 ENCOUNTER — Ambulatory Visit (INDEPENDENT_AMBULATORY_CARE_PROVIDER_SITE_OTHER): Payer: Medicare HMO

## 2023-02-23 DIAGNOSIS — I639 Cerebral infarction, unspecified: Secondary | ICD-10-CM | POA: Diagnosis not present

## 2023-02-23 LAB — CUP PACEART REMOTE DEVICE CHECK
Date Time Interrogation Session: 20241208230444
Implantable Pulse Generator Implant Date: 20211025

## 2023-03-30 ENCOUNTER — Ambulatory Visit (INDEPENDENT_AMBULATORY_CARE_PROVIDER_SITE_OTHER): Payer: Medicare HMO

## 2023-03-30 DIAGNOSIS — I639 Cerebral infarction, unspecified: Secondary | ICD-10-CM

## 2023-03-30 LAB — CUP PACEART REMOTE DEVICE CHECK
Date Time Interrogation Session: 20250112230353
Implantable Pulse Generator Implant Date: 20211025

## 2023-04-03 NOTE — Progress Notes (Signed)
Carelink Summary Report / Loop Recorder 

## 2023-05-04 ENCOUNTER — Ambulatory Visit (INDEPENDENT_AMBULATORY_CARE_PROVIDER_SITE_OTHER): Payer: Medicare HMO

## 2023-05-04 DIAGNOSIS — I639 Cerebral infarction, unspecified: Secondary | ICD-10-CM

## 2023-05-05 LAB — CUP PACEART REMOTE DEVICE CHECK
Date Time Interrogation Session: 20250216230309
Implantable Pulse Generator Implant Date: 20211025

## 2023-05-13 NOTE — Progress Notes (Signed)
 Carelink Summary Report / Loop Recorder

## 2023-06-08 ENCOUNTER — Encounter: Payer: Self-pay | Admitting: Internal Medicine

## 2023-06-08 ENCOUNTER — Ambulatory Visit (INDEPENDENT_AMBULATORY_CARE_PROVIDER_SITE_OTHER): Payer: Medicare HMO

## 2023-06-08 DIAGNOSIS — I639 Cerebral infarction, unspecified: Secondary | ICD-10-CM | POA: Diagnosis not present

## 2023-06-08 LAB — CUP PACEART REMOTE DEVICE CHECK
Date Time Interrogation Session: 20250323230212
Implantable Pulse Generator Implant Date: 20211025

## 2023-06-15 NOTE — Progress Notes (Signed)
 Carelink Summary Report / Loop Recorder

## 2023-06-15 NOTE — Addendum Note (Signed)
 Addended by: Geralyn Flash D on: 06/15/2023 01:37 PM   Modules accepted: Orders

## 2023-07-13 ENCOUNTER — Ambulatory Visit (INDEPENDENT_AMBULATORY_CARE_PROVIDER_SITE_OTHER): Payer: Medicare HMO

## 2023-07-13 DIAGNOSIS — I639 Cerebral infarction, unspecified: Secondary | ICD-10-CM | POA: Diagnosis not present

## 2023-07-13 LAB — CUP PACEART REMOTE DEVICE CHECK
Date Time Interrogation Session: 20250427230501
Implantable Pulse Generator Implant Date: 20211025

## 2023-07-14 ENCOUNTER — Encounter: Payer: Self-pay | Admitting: Internal Medicine

## 2023-07-28 NOTE — Progress Notes (Signed)
 Carelink Summary Report / Loop Recorder

## 2023-08-13 ENCOUNTER — Ambulatory Visit

## 2023-08-13 DIAGNOSIS — I639 Cerebral infarction, unspecified: Secondary | ICD-10-CM | POA: Diagnosis not present

## 2023-08-13 LAB — CUP PACEART REMOTE DEVICE CHECK
Date Time Interrogation Session: 20250528230416
Implantable Pulse Generator Implant Date: 20211025

## 2023-08-20 ENCOUNTER — Ambulatory Visit: Payer: Self-pay | Admitting: Internal Medicine

## 2023-08-26 ENCOUNTER — Telehealth: Payer: Self-pay

## 2023-08-26 NOTE — Telephone Encounter (Signed)
 Reviewed with Dr. Arlester Ladd in office, advised p waves are present and NOT AF.  No further action needed at this time.

## 2023-08-26 NOTE — Telephone Encounter (Signed)
 Alert received from [employer] for AF episode. 1 logged event 6/10 @ 23:48, duration , HR 118-140, EGM c/w ST with PAC's vs AF, no OAC - route to triage high alert . Presenting ongoing from 6/11 @ 01:08, increased HR's per trends.

## 2023-09-03 NOTE — Progress Notes (Signed)
 Carelink Summary Report / Loop Recorder

## 2023-09-14 ENCOUNTER — Ambulatory Visit (INDEPENDENT_AMBULATORY_CARE_PROVIDER_SITE_OTHER)

## 2023-09-14 ENCOUNTER — Ambulatory Visit: Payer: Self-pay | Admitting: Internal Medicine

## 2023-09-14 DIAGNOSIS — I639 Cerebral infarction, unspecified: Secondary | ICD-10-CM | POA: Diagnosis not present

## 2023-09-14 LAB — CUP PACEART REMOTE DEVICE CHECK
Date Time Interrogation Session: 20250629231901
Implantable Pulse Generator Implant Date: 20211025

## 2023-10-02 NOTE — Progress Notes (Signed)
 Carelink Summary Report / Loop Recorder

## 2023-10-15 ENCOUNTER — Encounter

## 2023-10-15 ENCOUNTER — Telehealth: Payer: Self-pay | Admitting: *Deleted

## 2023-10-15 LAB — CUP PACEART REMOTE DEVICE CHECK
Date Time Interrogation Session: 20250730230814
Implantable Pulse Generator Implant Date: 20211025

## 2023-10-15 NOTE — Telephone Encounter (Signed)
 ILR summary report received. Battery status OK. Normal device function. No new symptom, tachy, brady episodes. No new AF episodes. Monthly summary reports and ROV/PRN  Please review presenting rhythm.  Appears no activity. Routing to triage.  2 pause events from 2023-10-04 w/ true pauses.   AB, CVRS _______________________________________________________________________________  [place] family and they stated that the patient passed away on October 04, 2023. Patient removed from [facility] and status updated in [employer]. All future remote checks canceled in EPIC.   Routed to MD for awareness.

## 2023-10-16 DEATH — deceased

## 2023-10-17 ENCOUNTER — Ambulatory Visit: Payer: Self-pay | Admitting: Internal Medicine

## 2023-11-16 ENCOUNTER — Encounter

## 2023-12-16 NOTE — Progress Notes (Signed)
 Remote Loop Recorder Transmission

## 2023-12-17 ENCOUNTER — Encounter

## 2024-01-18 ENCOUNTER — Encounter

## 2024-02-18 ENCOUNTER — Encounter

## 2024-03-21 ENCOUNTER — Encounter

## 2024-04-21 ENCOUNTER — Encounter

## 2024-05-23 ENCOUNTER — Encounter

## 2024-06-23 ENCOUNTER — Encounter

## 2024-07-25 ENCOUNTER — Encounter

## 2024-08-25 ENCOUNTER — Encounter

## 2024-09-26 ENCOUNTER — Encounter

## 2024-10-27 ENCOUNTER — Encounter
